# Patient Record
Sex: Female | Born: 1985 | Race: Black or African American | Hispanic: No | Marital: Single | Smoking: Never smoker
Health system: Southern US, Community
[De-identification: ages and names within clinical notes are randomized; demographics above are authoritative.]

## PROBLEM LIST (undated history)

## (undated) DIAGNOSIS — D649 Anemia, unspecified: Secondary | ICD-10-CM

## (undated) DIAGNOSIS — A599 Trichomoniasis, unspecified: Secondary | ICD-10-CM

## (undated) DIAGNOSIS — N898 Other specified noninflammatory disorders of vagina: Secondary | ICD-10-CM

## (undated) DIAGNOSIS — R002 Palpitations: Secondary | ICD-10-CM

## (undated) DIAGNOSIS — J309 Allergic rhinitis, unspecified: Secondary | ICD-10-CM

## (undated) DIAGNOSIS — R202 Paresthesia of skin: Secondary | ICD-10-CM

## (undated) DIAGNOSIS — F419 Anxiety disorder, unspecified: Secondary | ICD-10-CM

## (undated) HISTORY — DX: Allergic rhinitis, unspecified: J30.9

## (undated) HISTORY — DX: Paresthesia of skin: R20.2

## (undated) HISTORY — DX: Anemia, unspecified: D64.9

## (undated) HISTORY — DX: Palpitations: R00.2

## (undated) HISTORY — DX: Trichomoniasis, unspecified: A59.9

## (undated) HISTORY — DX: Other specified noninflammatory disorders of vagina: N89.8

---
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
## 2005-08-25 ENCOUNTER — Other Ambulatory Visit: Admission: RE | Admit: 2005-08-25 | Discharge: 2005-08-25 | Payer: Self-pay | Admitting: Obstetrics and Gynecology

## 2005-12-28 ENCOUNTER — Emergency Department (HOSPITAL_COMMUNITY): Admission: EM | Admit: 2005-12-28 | Discharge: 2005-12-28 | Payer: Self-pay | Admitting: Emergency Medicine

## 2006-09-03 ENCOUNTER — Emergency Department (HOSPITAL_COMMUNITY): Admission: EM | Admit: 2006-09-03 | Discharge: 2006-09-03 | Payer: Self-pay | Admitting: Emergency Medicine

## 2008-10-27 ENCOUNTER — Emergency Department (HOSPITAL_COMMUNITY): Admission: EM | Admit: 2008-10-27 | Discharge: 2008-10-27 | Payer: Self-pay | Admitting: Emergency Medicine

## 2008-10-31 ENCOUNTER — Emergency Department (HOSPITAL_COMMUNITY): Admission: EM | Admit: 2008-10-31 | Discharge: 2008-10-31 | Payer: Self-pay | Admitting: Emergency Medicine

## 2008-11-02 ENCOUNTER — Emergency Department (HOSPITAL_COMMUNITY): Admission: EM | Admit: 2008-11-02 | Discharge: 2008-11-02 | Payer: Self-pay | Admitting: Emergency Medicine

## 2008-11-04 ENCOUNTER — Emergency Department (HOSPITAL_COMMUNITY): Admission: EM | Admit: 2008-11-04 | Discharge: 2008-11-04 | Payer: Self-pay | Admitting: Family Medicine

## 2008-11-16 ENCOUNTER — Emergency Department (HOSPITAL_COMMUNITY): Admission: EM | Admit: 2008-11-16 | Discharge: 2008-11-16 | Payer: Self-pay | Admitting: Emergency Medicine

## 2008-11-17 ENCOUNTER — Emergency Department (HOSPITAL_COMMUNITY): Admission: EM | Admit: 2008-11-17 | Discharge: 2008-11-17 | Payer: Self-pay | Admitting: Emergency Medicine

## 2009-06-08 ENCOUNTER — Emergency Department (HOSPITAL_COMMUNITY): Admission: EM | Admit: 2009-06-08 | Discharge: 2009-06-08 | Payer: Self-pay | Admitting: Family Medicine

## 2009-11-09 ENCOUNTER — Emergency Department (HOSPITAL_COMMUNITY): Admission: EM | Admit: 2009-11-09 | Discharge: 2009-11-09 | Payer: Self-pay | Admitting: Family Medicine

## 2010-04-06 LAB — POCT RAPID STREP A (OFFICE): Streptococcus, Group A Screen (Direct): NEGATIVE

## 2010-04-28 LAB — POCT I-STAT, CHEM 8
BUN: <3 mg/dL — ABNORMAL LOW (ref 6–23)
Calcium, Ion: 1.18 mmol/L (ref 1.12–1.32)
Chloride: 104 mEq/L (ref 96–112)
Glucose, Bld: 127 mg/dL — ABNORMAL HIGH (ref 70–99)
HCT: 39 % (ref 36.0–46.0)
Hemoglobin: 13.3 g/dL (ref 12.0–15.0)
Sodium: 138 mEq/L (ref 135–145)

## 2010-04-28 LAB — RAPID URINE DRUG SCREEN, HOSP PERFORMED
Opiates: NOT DETECTED
Tetrahydrocannabinol: NOT DETECTED

## 2010-04-28 LAB — GLUCOSE, CAPILLARY: Glucose-Capillary: 116 mg/dL — ABNORMAL HIGH (ref 70–99)

## 2010-04-28 LAB — T4, FREE: Free T4: 1.51 ng/dL (ref 0.80–1.80)

## 2010-04-28 LAB — T3, FREE: T3, Free: 3.2 pg/mL (ref 2.3–4.2)

## 2010-06-03 ENCOUNTER — Emergency Department (HOSPITAL_COMMUNITY)
Admission: EM | Admit: 2010-06-03 | Discharge: 2010-06-03 | Disposition: A | Discharge status: Home / Self Care | Payer: Self-pay | Attending: Emergency Medicine | Admitting: Emergency Medicine

## 2010-06-03 DIAGNOSIS — T169XXA Foreign body in ear, unspecified ear, initial encounter: Secondary | ICD-10-CM | POA: Insufficient documentation

## 2010-06-03 DIAGNOSIS — H6120 Impacted cerumen, unspecified ear: Secondary | ICD-10-CM | POA: Insufficient documentation

## 2010-06-03 DIAGNOSIS — IMO0002 Reserved for concepts with insufficient information to code with codable children: Secondary | ICD-10-CM | POA: Insufficient documentation

## 2010-08-02 ENCOUNTER — Emergency Department (HOSPITAL_COMMUNITY): Payer: Self-pay

## 2010-08-02 ENCOUNTER — Emergency Department (HOSPITAL_COMMUNITY)
Admission: EM | Admit: 2010-08-02 | Discharge: 2010-08-02 | Disposition: A | Discharge status: Home / Self Care | Payer: Self-pay | Attending: Emergency Medicine | Admitting: Emergency Medicine

## 2010-08-02 DIAGNOSIS — J9801 Acute bronchospasm: Secondary | ICD-10-CM | POA: Insufficient documentation

## 2010-08-02 DIAGNOSIS — R05 Cough: Secondary | ICD-10-CM | POA: Insufficient documentation

## 2010-08-02 DIAGNOSIS — R0602 Shortness of breath: Secondary | ICD-10-CM | POA: Insufficient documentation

## 2010-08-02 DIAGNOSIS — R059 Cough, unspecified: Secondary | ICD-10-CM | POA: Insufficient documentation

## 2010-08-03 ENCOUNTER — Emergency Department (HOSPITAL_COMMUNITY): Payer: Self-pay

## 2010-08-03 ENCOUNTER — Emergency Department (HOSPITAL_COMMUNITY)
Admission: EM | Admit: 2010-08-03 | Discharge: 2010-08-03 | Disposition: A | Discharge status: Home / Self Care | Payer: Self-pay | Attending: Emergency Medicine | Admitting: Emergency Medicine

## 2010-08-03 DIAGNOSIS — R0682 Tachypnea, not elsewhere classified: Secondary | ICD-10-CM | POA: Insufficient documentation

## 2010-08-03 DIAGNOSIS — F411 Generalized anxiety disorder: Secondary | ICD-10-CM | POA: Insufficient documentation

## 2010-08-03 DIAGNOSIS — R0602 Shortness of breath: Secondary | ICD-10-CM | POA: Insufficient documentation

## 2010-08-03 DIAGNOSIS — F410 Panic disorder [episodic paroxysmal anxiety] without agoraphobia: Secondary | ICD-10-CM | POA: Insufficient documentation

## 2010-08-03 LAB — CBC
HCT: 35 % — ABNORMAL LOW (ref 36.0–46.0)
MCHC: 32.3 g/dL (ref 30.0–36.0)
RDW: 16.6 % — ABNORMAL HIGH (ref 11.5–15.5)

## 2010-08-03 LAB — BASIC METABOLIC PANEL
BUN: 9 mg/dL (ref 6–23)
CO2: 16 mEq/L — ABNORMAL LOW (ref 19–32)
Chloride: 107 mEq/L (ref 96–112)
Creatinine, Ser: 0.65 mg/dL (ref 0.50–1.10)
GFR calc Af Amer: >60 mL/min (ref >60)
Glucose, Bld: 105 mg/dL — ABNORMAL HIGH (ref 70–99)

## 2010-08-03 LAB — DIFFERENTIAL
Basophils Absolute: 0 10*3/uL (ref 0.0–0.1)
Basophils Relative: 0 % (ref 0–1)
Eosinophils Relative: 1 % (ref 0–5)
Lymphocytes Relative: 22 % (ref 12–46)
Monocytes Absolute: 0.8 10*3/uL (ref 0.1–1.0)

## 2010-08-03 LAB — BLOOD GAS, ARTERIAL
Acid-base deficit: 8.5 mmol/L — ABNORMAL HIGH (ref 0.0–2.0)
Bicarbonate: 11.7 mEq/L — ABNORMAL LOW (ref 20.0–24.0)
O2 Saturation: 99.1 %
Patient temperature: 98.6

## 2010-08-06 ENCOUNTER — Emergency Department (HOSPITAL_COMMUNITY)
Admission: EM | Admit: 2010-08-06 | Discharge: 2010-08-06 | Disposition: A | Discharge status: Home / Self Care | Payer: Medicaid Other | Attending: Emergency Medicine | Admitting: Emergency Medicine

## 2010-08-06 DIAGNOSIS — F410 Panic disorder [episodic paroxysmal anxiety] without agoraphobia: Secondary | ICD-10-CM | POA: Insufficient documentation

## 2010-08-06 DIAGNOSIS — F411 Generalized anxiety disorder: Secondary | ICD-10-CM | POA: Insufficient documentation

## 2010-08-10 ENCOUNTER — Emergency Department (HOSPITAL_COMMUNITY)
Admission: EM | Admit: 2010-08-10 | Discharge: 2010-08-10 | Discharge status: Against Medical Advice | Payer: Medicaid Other | Attending: Emergency Medicine | Admitting: Emergency Medicine

## 2010-08-10 DIAGNOSIS — R0609 Other forms of dyspnea: Secondary | ICD-10-CM | POA: Insufficient documentation

## 2010-08-10 DIAGNOSIS — R6889 Other general symptoms and signs: Secondary | ICD-10-CM | POA: Insufficient documentation

## 2010-08-10 DIAGNOSIS — R0989 Other specified symptoms and signs involving the circulatory and respiratory systems: Secondary | ICD-10-CM | POA: Insufficient documentation

## 2010-08-17 ENCOUNTER — Emergency Department (HOSPITAL_COMMUNITY)
Admission: EM | Admit: 2010-08-17 | Discharge: 2010-08-17 | Disposition: A | Discharge status: Home / Self Care | Payer: Medicaid Other | Attending: Emergency Medicine | Admitting: Emergency Medicine

## 2010-08-17 DIAGNOSIS — R259 Unspecified abnormal involuntary movements: Secondary | ICD-10-CM | POA: Insufficient documentation

## 2010-08-17 DIAGNOSIS — R0602 Shortness of breath: Secondary | ICD-10-CM | POA: Insufficient documentation

## 2010-08-17 DIAGNOSIS — R630 Anorexia: Secondary | ICD-10-CM | POA: Insufficient documentation

## 2010-08-17 DIAGNOSIS — F411 Generalized anxiety disorder: Secondary | ICD-10-CM | POA: Insufficient documentation

## 2010-08-17 DIAGNOSIS — R0989 Other specified symptoms and signs involving the circulatory and respiratory systems: Secondary | ICD-10-CM | POA: Insufficient documentation

## 2010-08-17 DIAGNOSIS — J3489 Other specified disorders of nose and nasal sinuses: Secondary | ICD-10-CM | POA: Insufficient documentation

## 2010-08-17 DIAGNOSIS — R0789 Other chest pain: Secondary | ICD-10-CM | POA: Insufficient documentation

## 2010-08-17 DIAGNOSIS — R0609 Other forms of dyspnea: Secondary | ICD-10-CM | POA: Insufficient documentation

## 2010-10-14 ENCOUNTER — Ambulatory Visit (INDEPENDENT_AMBULATORY_CARE_PROVIDER_SITE_OTHER): Payer: Self-pay | Admitting: Sports Medicine

## 2010-10-14 ENCOUNTER — Encounter: Payer: Self-pay | Admitting: Sports Medicine

## 2010-10-14 VITALS — BP 123/71 | HR 81 | Temp 98.5°F | Ht 69.5 in | Wt 165.0 lb

## 2010-10-14 DIAGNOSIS — F411 Generalized anxiety disorder: Secondary | ICD-10-CM

## 2010-10-14 DIAGNOSIS — F419 Anxiety disorder, unspecified: Secondary | ICD-10-CM | POA: Insufficient documentation

## 2010-10-14 DIAGNOSIS — E0590 Thyrotoxicosis, unspecified without thyrotoxic crisis or storm: Secondary | ICD-10-CM

## 2010-10-14 MED ORDER — BUSPIRONE HCL 10 MG PO TABS: 10.0000 mg | ORAL_TABLET | Freq: Two times a day (BID) | ORAL | Status: DC

## 2010-10-14 NOTE — Patient Instructions (Addendum)
It was nice meeting you today.  You said that you think you can take the medication Monday through Friday and see how you feel.  I suspect that you will want to to continue it after that . . .  We are going to check your thyroid levels and we will call if anything is abnormal.    Lets plan to see each other in 2-3 weeks.

## 2010-10-18 ENCOUNTER — Encounter: Payer: Self-pay | Admitting: Sports Medicine

## 2010-10-18 DIAGNOSIS — E0590 Thyrotoxicosis, unspecified without thyrotoxic crisis or storm: Secondary | ICD-10-CM | POA: Insufficient documentation

## 2010-10-18 NOTE — Progress Notes (Addendum)
25 yo female presenting to clinic for jitteriness and tremor.  She states her symptoms are worse when under stress; does not disrupt sleep; and that distraction lessens her tremor.  She has noticed these symptoms worsening since beginning school again but reports no other life changes that would be attributable to her symptoms. No mood changes; Does go to [facility] for counseling for anxiety Marchelle Folks [place]healage = counselor); 5 ED visits since July.  Have tried Ativan in past does not like the way it makes her feel.    She does report an 26# wt loss in the from her heaviest weight and a ~10# loss in last six months but no changes in nails/hair texture, no skin changes.  No bowel disturbances.  No sleep disturbances.  ROS: As per HPI otherwise 12 point ROS performed and negative.  PE: GENERAL:  Young adult African-American female, examined in [facility].  Alert and appropriately interactive but unable to sit still; jittery.  In mild discomfort; no respiratory distress. HNEENT: PERRLA, extra ocular movement intact, sclera clear, anicteric, oropharynx clear, no lesions, neck supple with midline trachea and thyroid without masses; thyroid not enlarged THORAX: HEART: S1, S2 normal, no murmur, rub or gallop, regular rate and rhythm LUNGS: clear to auscultation, no wheezes or rales and unlabored breathing ABDOMEN:  abdomen is soft without significant tenderness, masses, organomegaly or guarding EXTREMITIES: 2+.4 pulses; no edema,

## 2010-10-18 NOTE — Progress Notes (Signed)
TSH Result shows decresed levels -  Will persue hyperthyroidism workup.

## 2010-10-18 NOTE — Assessment & Plan Note (Signed)
Will start Buspar to help with apparent chronic anxiety.  Will also check TSH to rule out endocrine cause

## 2010-10-22 ENCOUNTER — Emergency Department (HOSPITAL_COMMUNITY)
Admission: EM | Admit: 2010-10-22 | Discharge: 2010-10-22 | Discharge status: Against Medical Advice | Payer: Medicaid Other | Attending: Emergency Medicine | Admitting: Emergency Medicine

## 2010-10-22 DIAGNOSIS — F411 Generalized anxiety disorder: Secondary | ICD-10-CM | POA: Insufficient documentation

## 2010-10-24 ENCOUNTER — Telehealth: Payer: Self-pay | Admitting: Sports Medicine

## 2010-10-24 DIAGNOSIS — E0590 Thyrotoxicosis, unspecified without thyrotoxic crisis or storm: Secondary | ICD-10-CM

## 2010-10-24 NOTE — Telephone Encounter (Signed)
Thought that she got bitten by a spider prompting an anxiety attack that lead to an ED visit over the weekend.    Side effects - slight dizziness, nausea and bad taste;  Jitteriness is improved but still intermittently returns.    Will come in to get TSH, T3, T4 checked at nurses visit.

## 2010-10-24 NOTE — Assessment & Plan Note (Signed)
Will bring in for nurses visit to check tsh t3 t4

## 2010-10-27 ENCOUNTER — Other Ambulatory Visit: Payer: Medicaid Other

## 2010-10-27 DIAGNOSIS — E0590 Thyrotoxicosis, unspecified without thyrotoxic crisis or storm: Secondary | ICD-10-CM

## 2010-10-27 NOTE — Progress Notes (Signed)
Tsh,f-t3 and f-t4 done today [facility] Derin Matthes

## 2010-10-28 LAB — T3, FREE: T3, Free: 3.4 pg/mL (ref 2.3–4.2)

## 2010-10-31 ENCOUNTER — Encounter: Payer: Self-pay | Admitting: Sports Medicine

## 2010-10-31 ENCOUNTER — Ambulatory Visit (INDEPENDENT_AMBULATORY_CARE_PROVIDER_SITE_OTHER): Payer: Medicaid Other | Admitting: Sports Medicine

## 2010-10-31 VITALS — BP 114/74 | HR 66 | Temp 98.2°F | Ht 69.6 in | Wt 163.0 lb

## 2010-10-31 DIAGNOSIS — F411 Generalized anxiety disorder: Secondary | ICD-10-CM

## 2010-10-31 DIAGNOSIS — N898 Other specified noninflammatory disorders of vagina: Secondary | ICD-10-CM | POA: Insufficient documentation

## 2010-10-31 DIAGNOSIS — F419 Anxiety disorder, unspecified: Secondary | ICD-10-CM

## 2010-10-31 DIAGNOSIS — Z23 Encounter for immunization: Secondary | ICD-10-CM

## 2010-10-31 DIAGNOSIS — R87619 Unspecified abnormal cytological findings in specimens from cervix uteri: Secondary | ICD-10-CM

## 2010-10-31 HISTORY — DX: Other specified noninflammatory disorders of vagina: N89.8

## 2010-10-31 MED ORDER — CLOTRIMAZOLE 1 % VA CREA
1.0000 | TOPICAL_CREAM | Freq: Every day | VAGINAL | Status: AC
Start: 2010-10-31 — End: 2010-11-07

## 2010-10-31 NOTE — Patient Instructions (Signed)
It was good to see you today.  I'm glad that you are feeling better and that the medication is working well for you.  Please fill the prescription for clotramazole   We are scheduling an appointment for your Colposcopy that will be done here.  Plan on seeing me in 2-3 weeks to follow up how you are feeling.

## 2010-11-03 ENCOUNTER — Ambulatory Visit (INDEPENDENT_AMBULATORY_CARE_PROVIDER_SITE_OTHER): Payer: Medicaid Other | Admitting: Family Medicine

## 2010-11-03 ENCOUNTER — Encounter: Payer: Self-pay | Admitting: Family Medicine

## 2010-11-03 ENCOUNTER — Other Ambulatory Visit (HOSPITAL_COMMUNITY)
Admission: RE | Admit: 2010-11-03 | Discharge: 2010-11-03 | Disposition: A | Discharge status: Home / Self Care | Payer: Medicaid Other | Source: Ambulatory Visit | Attending: Family Medicine | Admitting: Family Medicine

## 2010-11-03 VITALS — BP 117/61 | HR 54 | Temp 97.6°F | Ht 69.6 in | Wt 166.0 lb

## 2010-11-03 DIAGNOSIS — Z124 Encounter for screening for malignant neoplasm of cervix: Secondary | ICD-10-CM

## 2010-11-03 DIAGNOSIS — R87619 Unspecified abnormal cytological findings in specimens from cervix uteri: Secondary | ICD-10-CM

## 2010-11-03 DIAGNOSIS — Z01419 Encounter for gynecological examination (general) (routine) without abnormal findings: Secondary | ICD-10-CM | POA: Insufficient documentation

## 2010-11-03 DIAGNOSIS — Z309 Encounter for contraceptive management, unspecified: Secondary | ICD-10-CM

## 2010-11-03 DIAGNOSIS — R87810 Cervical high risk human papillomavirus (HPV) DNA test positive: Secondary | ICD-10-CM | POA: Insufficient documentation

## 2010-11-03 LAB — POCT URINE PREGNANCY: Preg Test, Ur: NEGATIVE

## 2010-11-04 DIAGNOSIS — R87619 Unspecified abnormal cytological findings in specimens from cervix uteri: Secondary | ICD-10-CM | POA: Insufficient documentation

## 2010-11-04 NOTE — Progress Notes (Signed)
  Subjective:    Patient ID: Elizabeth Hines, female    DOB: 08-11-1985, 25 y.o.   MRN: [id number]  HPI Abnormal Pap smear according to patient. Pap was done at the [facility]. She does not have the results. History of prior abnormal Pap and reportedly normal colposcopy done in 2011. No vaginal discharge at this time. No pelvic pain.    Review of Systems    Pertinent review of systems: negative for fever or unusual weight change.  Objective:   Physical Exam  GU externally normal Patient given informed consent, signed copy in the chart.  Placed in lithotomy position. Cervix viewed with speculum and colposcope after application of acetic acid.   Colposcopy adequate (entire squamocolumnar junctions seen  in entirety) ?  yes Acetowhite lesions?No Punctation?No Mosaicism?  No Abnormal vasculature?  No Biopsies?None ECC?No Complications? None  COMMENTS: Patient was given post procedure instructions.  I will notify her of any pathology results.       Assessment & Plan:  History of abnormal Paps, history of reportedly clinical normal colposcopy sometime within the last 2 years. He do not have any of these records. Today we will ahead and did a Pap smear as well as a colposcopy. Colposcopy was clinically normal. We'll await the results of her Pap smear.

## 2010-11-07 LAB — CBC
HCT: 39.7
MCV: 77.5 — ABNORMAL LOW
Platelets: 237
RDW: 17.1 — ABNORMAL HIGH

## 2010-11-07 LAB — DIFFERENTIAL
Basophils Absolute: 0
Eosinophils Absolute: 0
Eosinophils Relative: 1
Lymphocytes Relative: 37

## 2010-11-07 LAB — PROTIME-INR: Prothrombin Time: 14.2

## 2010-11-10 ENCOUNTER — Encounter: Payer: Self-pay | Admitting: Family Medicine

## 2010-11-14 ENCOUNTER — Telehealth: Payer: Self-pay | Admitting: Sports Medicine

## 2010-11-14 NOTE — Telephone Encounter (Signed)
Ms. Aydt is calling for Dr. Berline Chough, she says this is very important.  She is trying to figure out if one of the meds she is taking is causing her to have difficulty breathing or maybe it is her anxiety.

## 2010-11-14 NOTE — Telephone Encounter (Signed)
Returned call.  No answer.  Left message to call us back.

## 2010-11-17 NOTE — Assessment & Plan Note (Signed)
Will refer for colpo

## 2010-11-17 NOTE — Assessment & Plan Note (Signed)
No changes at this time to meds Will likely benefit from increasing dose but is not interested in that this visit.

## 2010-11-17 NOTE — Assessment & Plan Note (Signed)
Will rx clotrimaxole and refer to women's clinic for colpo

## 2010-11-17 NOTE — Progress Notes (Signed)
Subject: Pt reports feeling better.  Jitteryness is improved and she reports feeling much calmer.  Not interested in escalating medication amount right now.    ROS: Denies dyspnea, cp. No nausea, vomiting No constipation, diarrhea No changes in vision, hearing smell or taste Positive vaginal discharge, non odorus.  Also needs colpo for abnormal pap in past  PE: GENERAL:  Young adult African-American female, examined in [facility].  Alert and appropriate.  Much calmer than last visit.  Able to sit still and focus on conversation..  In no discomfort; no respiratory distress. HNEENT: extra ocular movement intact, sclera clear, anicteric and thyroid without masses THORAX: HEART: S1, S2 normal, no murmur, rub or gallop, regular rate and rhythm LUNGS: clear to auscultation, no wheezes or rales and unlabored breathing ABDOMEN:  abdomen is soft without significant tenderness, masses, organomegaly or guarding EXTREMITIES: warm well perfused distal pulses intact

## 2010-11-21 ENCOUNTER — Encounter: Payer: Self-pay | Admitting: Sports Medicine

## 2010-11-21 ENCOUNTER — Ambulatory Visit (INDEPENDENT_AMBULATORY_CARE_PROVIDER_SITE_OTHER): Payer: Medicaid Other | Admitting: Sports Medicine

## 2010-11-21 DIAGNOSIS — R87619 Unspecified abnormal cytological findings in specimens from cervix uteri: Secondary | ICD-10-CM

## 2010-11-21 DIAGNOSIS — F411 Generalized anxiety disorder: Secondary | ICD-10-CM

## 2010-11-21 DIAGNOSIS — F419 Anxiety disorder, unspecified: Secondary | ICD-10-CM

## 2010-11-21 NOTE — Assessment & Plan Note (Signed)
Seen by Dr. Jennette Kettle in women's clinic for colposcopy.  Posterior is negative for any lesions no biopsies were performed. 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 She'll need repeat Pap smear in one year.

## 2010-11-21 NOTE — Progress Notes (Signed)
Subjective:  MICHELENE KENISTON is a 25 y.o. female presenting today for followup of her chronic anxiety. She reports feeling much more mellow then she has in the past and attributes this to the medication. She reports she has had one at episode of headache malaise for 2 days meds will accompanying nausea but no vomiting she has had some associated dizziness made her have the sensation of stumbling although she never lost her balance. The headache is left-sided frontal and is associated with increased mucus production and cough. The symptoms have resolved at this time. Her anxiety is much better control as she does have a some twitching still especially which couldn't stressed.   ROS: Denies: fevers, chills.  No chest pain. Remainder of  review of systems as above.    PE: GENERAL:  Young adult African-American female, examined in [facility].  Alert and appropriate; occasional tics when talking about stress.  In no discomfort; no respiratory distress. HNEENT:  H&N: supple, no adenopathy and trachea midline OROPHARYNX: lips, mucosa, and tongue normal; teeth and gums normal EYES: conjunctivae/corneas clear. PERRL, EOM's intact. Fundi benign. EARS: normal TM's and external ear canals both ears NOSE: mucosal erythema THORAX: HEART: S1, S2 normal, no murmur, rub or gallop, regular rate and rhythm LUNGS: clear to auscultation, no wheezes or rales and unlabored breathing EXTREMITIES: warm well perfused; no deformity  >PSYCH: appears less stressed than last visit.  She's able to maintain focus and eye contact through the exam. She has good insight into her condition.

## 2010-11-21 NOTE — Patient Instructions (Addendum)
It was good to see you again today Elizabeth Hines.  Let's plan to continue your BuSpar and readdress this in the new year; you have 5 remaining refills on your BuSpar.  Regarding your abnormal Pap smear, this is consistent with prior Paps that you have had and will only continue further surveillance at this time. The next treatment for your abnormal Pap smear is the colposcopy you have already done.  I will discuss with Dr. Jennette Kettle, when you should return for a repeat Pap smear.  I like you to get over the counter Claritin and take one tablet by mouth each evening before bed. This will help with some of the symptoms you have been having specially her headaches.  With plan on seeing each other in 3 weeks to check to see how you're doing overall.

## 2010-11-21 NOTE — Assessment & Plan Note (Signed)
Will continue BuSpar at this time. We negotiated that we consider medication through the new year and that time we can reassess.  Patient does elicit some concerns that this could be an underlying neurologic issue however reassured by the fact this has improved.  He not feel like a neurology referral is appropriate this time however will consider symptoms worsen.  We will consider increasing her BuSpar over the next couple of months to a higher dose if we are looking for a more therapeutic effect.

## 2010-11-22 ENCOUNTER — Encounter: Payer: Self-pay | Admitting: Sports Medicine

## 2010-11-22 DIAGNOSIS — N760 Acute vaginitis: Secondary | ICD-10-CM | POA: Insufficient documentation

## 2010-11-22 DIAGNOSIS — B3731 Acute candidiasis of vulva and vagina: Secondary | ICD-10-CM | POA: Insufficient documentation

## 2010-11-22 DIAGNOSIS — N870 Mild cervical dysplasia: Secondary | ICD-10-CM

## 2010-11-22 DIAGNOSIS — B9689 Other specified bacterial agents as the cause of diseases classified elsewhere: Secondary | ICD-10-CM | POA: Insufficient documentation

## 2010-11-22 DIAGNOSIS — B373 Candidiasis of vulva and vagina: Secondary | ICD-10-CM | POA: Insufficient documentation

## 2010-11-22 HISTORY — DX: Mild cervical dysplasia: N87.0

## 2010-12-22 ENCOUNTER — Encounter: Payer: Self-pay | Admitting: Sports Medicine

## 2010-12-22 ENCOUNTER — Ambulatory Visit (INDEPENDENT_AMBULATORY_CARE_PROVIDER_SITE_OTHER): Payer: Medicaid Other | Admitting: Sports Medicine

## 2010-12-22 DIAGNOSIS — B373 Candidiasis of vulva and vagina: Secondary | ICD-10-CM

## 2010-12-22 DIAGNOSIS — F411 Generalized anxiety disorder: Secondary | ICD-10-CM

## 2010-12-22 DIAGNOSIS — F419 Anxiety disorder, unspecified: Secondary | ICD-10-CM

## 2010-12-22 MED ORDER — FLUCONAZOLE 150 MG PO TABS: 150.0000 mg | ORAL_TABLET | Freq: Once | ORAL | Status: AC

## 2010-12-22 MED ORDER — BUSPIRONE HCL 15 MG PO TABS: 15.0000 mg | ORAL_TABLET | Freq: Two times a day (BID) | ORAL | Status: DC

## 2010-12-22 NOTE — Patient Instructions (Signed)
It was great seeing you today.  We are in ago up on your BuSpar to 15 mg every 12 hours. Finish your current prescription of 10 mg and when you refill your medications please pickup the 15 mg.  I also sent in a prescription for fluconazole it should be ready later today.  Please schedule a followup appointment in 3-4 weeks.

## 2010-12-22 NOTE — Progress Notes (Signed)
Patient ID: Elizabeth Hines, female   DOB: 17-Apr-1985, 25 y.o.   MRN: [id number] Subjective:  Elizabeth Hines is a 25 y.o. female presenting today for followup for her chronic anxiety and for vaginal discharge and odor. She does report her chronic anxiety has been reduced by about 50% due to initiation of BuSpar. She does report one episode of depersonalization occurred one week after her last visit however she said she was able to work through it with her coping skills taught by Selena Batten her counselor at [employer] clinic. She does report that her counselor did suggest that they increased dose of BuSpar may be appropriate at this time and she is agreeable to this at this time. She does report of BuSpar does give her some mild dizziness it does make her feel mellow and somewhat tired although the dizziness and tiredness are subsiding longer she takes the medication.  She does report return of vaginal discharge as well as odor. E. cells were seen on her last Pap smear is performed with her Colposcopy.  She does state that she started using a different bath soap that has caused problems in the past for a period she will refrain from using soaps such as this and continue to use dial which is what she has used well in the past denies any urinary frequency hesitancy or hematuria.   She does report wanting to go back to school and is currently searching for a job although she is very frustrated with the job application process.  ROS: Denies any chest pain shortness of breath. No cough. Some dizziness as above no syncope or presyncope. Decrease jitteriness. Remainder of review of systems as per history of present illness.  PE: GENERAL:  Adult African American female examined in [facility]. Alert appropriately interactive mild jitteriness. No discomfort no respiratory distress. HNEENT: extra ocular movement intact, sclera clear, anicteric and trachea midline THORAX: HEART: S1, S2 normal, no murmur, rub or gallop, regular  rate and rhythm LUNGS: clear to auscultation, no wheezes or rales and unlabored breathing ABDOMEN:  abdomen is soft without significant tenderness, masses, organomegaly or guarding EXTREMITIES: Warm well-perfused no swelling, 2+ out of 4 distal pulses.

## 2010-12-22 NOTE — Assessment & Plan Note (Signed)
We will prescribe Diflucan and provide 2 tablets to be taken 3 days apart. Patient does report that one pill as not been enough in the past. She may be a candidate for prolonged Diflucan treatment of one pill Q. weekly for 6 weeks if she continues to have persistent infections.

## 2010-12-22 NOTE — Assessment & Plan Note (Addendum)
We will increase her BuSpar to 15 mg every 12 hours. She is very hesitant to continue this medication generally speaking however does recognize that this is greatly diminished her chronic anxiety by approximately 50%. Her jitteriness/tremulousness is also greatly improved since initiation of this medication. We'll continue to monitor her and continue counseling as this has been a great decrease in her daily disability and/or ED visits have been required for her anxiety.   We'll plan on performing PHQ. 9 at the next visit.

## 2010-12-27 ENCOUNTER — Telehealth: Payer: Self-pay | Admitting: *Deleted

## 2010-12-27 NOTE — Telephone Encounter (Signed)
Returned call to patient.  Wanted to know if she was to take increased dose of Buspar 15mg  once or twice a day.  Patient informed med should be taken twice a day (every 12 hours) per 12/22/10 office note.  Also, wanted to know how to take Diflucan that was prescribed.  Informed to take Diflucan 1 tablet and repeat 2nd tablet in 3 days.  Patient verbalized understanding.  Gaylene Brooks, RN

## 2011-01-04 ENCOUNTER — Telehealth: Payer: Self-pay | Admitting: Sports Medicine

## 2011-01-04 NOTE — Telephone Encounter (Signed)
Pt asking to speak with MD, says the buspar makes her feel more dizzy than the last medication prescribed

## 2011-01-05 NOTE — Telephone Encounter (Signed)
Patient states that the Buspar was making her dizzy, but yesterday she did not have that affect. We agreed that she will wait another day or through the weekend first to see what happens before any change is considered. Patient was also prescribed Diflucan and only took 1 pill due to the fact she was concerned about the interaction between the Buspar and that medication. She was wanting to know if the Diflucan will still work.

## 2011-01-10 NOTE — Telephone Encounter (Signed)
States dizziness has resolved after the first 3 days.  States that the new dose is not having worse side effects and that she is feeling better now.    She still expressed great concern regarding duration of therapy for her Buspar.  I explained over a long telephone conversation that she will likely need this medication as a long term med but not necessarily as a permanent medication.  She did report that she is apprehensive about stopping the medication because of how well she is doing.

## 2011-01-26 ENCOUNTER — Ambulatory Visit (INDEPENDENT_AMBULATORY_CARE_PROVIDER_SITE_OTHER): Payer: Medicaid Other | Admitting: Sports Medicine

## 2011-01-26 ENCOUNTER — Encounter: Payer: Self-pay | Admitting: Sports Medicine

## 2011-01-26 DIAGNOSIS — B373 Candidiasis of vulva and vagina: Secondary | ICD-10-CM

## 2011-01-26 DIAGNOSIS — F419 Anxiety disorder, unspecified: Secondary | ICD-10-CM

## 2011-01-26 DIAGNOSIS — F411 Generalized anxiety disorder: Secondary | ICD-10-CM
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
## 2011-01-26 NOTE — Patient Instructions (Addendum)
As always it was great to see today. I am glad to hear you're doing very well and that you have had a great new year.  Lets continue with our current plan of the Buspar 15mg  every 12 hours.  I am glad to hear the dizziness is improving.  Please let us know if it worsens or any other concerns come up.    Keep up with the writings and with yoga.  Try to increase how often you are doing both of these as it seems they are both helping with your anxiety.   I will plan on seeing you in 3-4 weeks to check on your progress.  Remember to write down any questions you have so that you can bring them with you when you see me next.

## 2011-01-28 NOTE — Assessment & Plan Note (Addendum)
Much improved on current dose of Buspar.  Will continue treatment at this time with no changes.  Pt unwilling to increase dose due to concerns for increase adverse effects.  Will continue with frequent follow up until pt more comfortable on stable dose.

## 2011-01-28 NOTE — Assessment & Plan Note (Signed)
Improved following last treatment with diflucan.  If reoccurs in near future would consider 6 week course of once weekly diflucan.  Will need wet prep with next reoccurance.

## 2011-02-14 ENCOUNTER — Ambulatory Visit (INDEPENDENT_AMBULATORY_CARE_PROVIDER_SITE_OTHER): Payer: Medicaid Other | Admitting: Sports Medicine

## 2011-02-14 ENCOUNTER — Encounter: Payer: Self-pay | Admitting: Sports Medicine

## 2011-02-14 VITALS — BP 128/76 | HR 88 | Temp 99.2°F | Ht 69.25 in | Wt 176.3 lb

## 2011-02-14 DIAGNOSIS — J069 Acute upper respiratory infection, unspecified: Secondary | ICD-10-CM | POA: Insufficient documentation

## 2011-02-14 DIAGNOSIS — F411 Generalized anxiety disorder: Secondary | ICD-10-CM

## 2011-02-14 DIAGNOSIS — F419 Anxiety disorder, unspecified: Secondary | ICD-10-CM

## 2011-02-14 DIAGNOSIS — L709 Acne, unspecified: Secondary | ICD-10-CM | POA: Insufficient documentation

## 2011-02-14 DIAGNOSIS — L708 Other acne: Secondary | ICD-10-CM

## 2011-02-14 MED ORDER — BUSPIRONE HCL 10 MG PO TABS: 10.0000 mg | ORAL_TABLET | Freq: Every day | ORAL | Status: DC

## 2011-02-14 MED ORDER — BUSPIRONE HCL 15 MG PO TABS: 15.0000 mg | ORAL_TABLET | Freq: Every day | ORAL | Status: DC

## 2011-02-14 NOTE — Assessment & Plan Note (Signed)
Patient does report much improvement of her anxiety with BuSpar, exercise and relaxation techniques.  She is continuously Veterinary surgeon. She does report some occasional severe dizziness following her morning dose of BuSpar. We will decrease to 10 mg every morning with 15 mg dosing at night. She'll also try to be a meal prior to taking her medication each time she is reporting this makes it better

## 2011-02-14 NOTE — Assessment & Plan Note (Addendum)
Mild tenderness over right maxilla likely to inflammation. No antibiotic indicated at this point as patient has not had symptoms for greater than 10-14 days. Patient given warning signs and instructions to call for return appointment if worsening with recurrent fevers. We'll continue to provide supportive treatment as per AVS

## 2011-02-14 NOTE — Patient Instructions (Signed)
It was great seeing you today.  I'm sorry to here you've been under the weather for the past week. I anticipate this getting better over the next week or 2. Continue with Robitussin you have been using it needed.  If it seems like you are getting worse and start running fevers or having chills please come back to the clinic to be reevaluated.   Regarding your skin. It is very important to establish a good cleaning and moisturizing routine.  I would suggest that you get Cetaphil face cleanser (or the generic equivalent) and use either Cetaphil or Dove face moisturizer.  It is important that you watch your face and use moisturizer twice a day the first in the morning and right before bed. If you have not had much improvement at her next visit we will talk about other medications we can use.  Regarding her BuSpar. I have given you a printout about the medication from [employer]. I will also look over the page of questions you brought with you and see if any are not answered. I'm glad answer these questions the next visit when I'm able to find all the correct answers for you.  We are going to change her medication regimen due to your dizziness to a 10 mg tablet at 11 AM, and a 15 mg tablet at 11 PM. Be sure to eat prior to taking this medication as he stated your dizziness is less severe when you're able to eat a meal before taking your medication.  Lets plan to see each other back in 3-4 weeks and follow up how you're doing.

## 2011-02-14 NOTE — Assessment & Plan Note (Signed)
Patient will establish routine per AVS.  Consider addition of other over-the-counter medications at next visit do to lack of insurance. May eventually benefit from a retinoid agent.

## 2011-02-14 NOTE — Progress Notes (Signed)
Patient ID: Elizabeth Hines, female   DOB: 1985/07/13, 26 y.o.   MRN: [id number] Subjective:  Elizabeth Hines is a 26 y.o. female presenting today for followup of her anxiety, one week of cold like symptoms, as well as questions regarding acne.  Anxiety: Patient reports anxiety is improved 70% since baseline. Which is a 10% improvement from last visit. She does however report an extreme dizziness after taking her BuSpar especially in the mornings after not eating a meal. She is occasionally had to take a nap due to the dizziness. Does not report any falls or trauma.   URI:  complains of congestion and productive cough for 7 days. She denies a history of anorexia, fevers, myalgias, nausea, shortness of breath, sweats, vomiting and weakness and does not a history of asthma. Patient denies smoke cigarettes.  SKIN: Reports no established daily hygiene practice. Has tried various over-the-counter cleansers and astringents. Does not use these on a regular basis in the past.  Patient does report touching her face quite often   ROS  Constitutional  reports dizziness as above   Infectious  reports no fevers or chills   Resp  report some facial pressure as well as rhinorrhea and mild cough   Cardiac  no palpitations   GI  no nausea vomiting constipation diarrhea      Skin  history of acne with worsening comedones on face.   Psych  mood  And anxiety as above   Neuro  tremor has much improved still with an occasional tic.   Trauma  none reported   MEDS  taking her medications on a regular basis.   ROS as per HPI and above otherwise 12 point ROS negative.   PE: GENERAL:  Adult Latin American female, examined in [facility]. In no acute discomfort, no respiratory distress HNEENT: AT/[place], MMM, no scleral icterus, EOMi, scattered comedones over bilateral cheeks and jawline.  Mild tenderness over R maxilla THORAX: HEART: RRR, S1/S2 heard, no murmur LUNGS: CTA B, no wheezes, no crackles EXTREMITIES: Moves all 4  extremities spontaneously, warm well perfused, no edema,  >PSYCH: Patient is still quite tangential, with nervous pressured speech on occasion. >NEURO: Occasional involuntary tic of the flexors of the upper extremity

## 2011-02-24 ENCOUNTER — Telehealth: Payer: Self-pay | Admitting: Sports Medicine

## 2011-02-24 NOTE — Telephone Encounter (Signed)
Small anxiety attack:   throat got dry, while in store - water in store made it better. Does report some mild cough and congestion.  Reassured this can be normal this time of the year and she may be coming down with a viral URI.    She reports deep breathing seemed to help.  Reassured that nothing she has said or experienced today seems to be life threatening.  Informed to call our emergency line if she is unsure if she needs to be evaluated in ED.  Pt does have multiple ED visits for prior anxiety attacks but has been much improved with Buspar.    Gaspar Bidding, DO [facility] Resident - PGY-1 02/24/2011 5:39 PM

## 2011-02-24 NOTE — Telephone Encounter (Signed)
Returned call to patient.  Left message---MD in clinic with patients.  Will route note to Dr. Berline Chough to contact Pt.  Gaylene Brooks, RN

## 2011-02-24 NOTE — Telephone Encounter (Signed)
Patient is calling to leave a message for Dr. Berline Chough.  She feel like she just had an anxiety attack and she wants to talk to him.

## 2011-02-28 ENCOUNTER — Telehealth: Payer: Self-pay | Admitting: Sports Medicine

## 2011-02-28 NOTE — Telephone Encounter (Signed)
Patient is calling because she wanted to speak to Dr. Berline Chough about when the best time is to get her PAP Smears.  The last was abnormal and she is having a hard time keeping up with when she needs to have them.

## 2011-02-28 NOTE — Telephone Encounter (Signed)
Called pt and informed her that I would forward the message to Dr. Berline Chough for advise.Laureen Ochs, Viann Shove

## 2011-03-02 NOTE — Telephone Encounter (Signed)
Please have her return in October for repeat PAP smear.  This is per recommendations from Dr. Jennette Kettle (who saw her in [facility] in 10/2010).  Dr. Jennette Kettle sent a letter regarding her findings.  If pt requests please resend a copy of the letter.

## 2011-03-02 NOTE — Telephone Encounter (Signed)
Resent letter.

## 2011-03-03 ENCOUNTER — Ambulatory Visit (INDEPENDENT_AMBULATORY_CARE_PROVIDER_SITE_OTHER): Payer: Self-pay | Admitting: Sports Medicine

## 2011-03-03 ENCOUNTER — Encounter: Payer: Self-pay | Admitting: Sports Medicine

## 2011-03-03 VITALS — BP 118/62 | HR 62 | Temp 98.9°F | Ht 69.6 in | Wt 178.0 lb

## 2011-03-03 DIAGNOSIS — F411 Generalized anxiety disorder: Secondary | ICD-10-CM

## 2011-03-03 DIAGNOSIS — N870 Mild cervical dysplasia: Secondary | ICD-10-CM

## 2011-03-03 DIAGNOSIS — L709 Acne, unspecified: Secondary | ICD-10-CM

## 2011-03-03 DIAGNOSIS — F419 Anxiety disorder, unspecified: Secondary | ICD-10-CM

## 2011-03-03 DIAGNOSIS — L708 Other acne: Secondary | ICD-10-CM

## 2011-03-03 NOTE — Assessment & Plan Note (Signed)
Pt needs repeat PAP in 10/2011.  Letter sent 2/7 to those regards and addressed with pt today

## 2011-03-03 NOTE — Assessment & Plan Note (Signed)
Non adherance to prior plan but pt willing to try again

## 2011-03-03 NOTE — Patient Instructions (Signed)
It was nice to see you today.  I am glad that your dizziness is much better and that your anxiety is still doing well.    Please see below for details about what we discussed:   We will get you a referal to a dentist - our office will be in touch  Remember to use the face regimen we talked about last time for your ACNE  For your dry throat you can dry Claritin or Zyrtec.  You can also try adding a humidifier to help with dry air in your home during the winter.  Try placing a bowl of water over the heat vent if you can't get a humidifier.    Please plan to return to see me in the middle of March.  If you need anything prior to seeing me please call the clinic.

## 2011-03-03 NOTE — Progress Notes (Signed)
Subjective:  Elizabeth Hines is a 26 y.o. female presenting today for follow up of her chronic anxiety.  Reports much improved overall.  She would like a referral to dentistry for a dental cleaning.  Reports some dry throat on occasion but no other focal resp symptoms.  Some concern of decreased sex drive since starting Buspar   ROS   Constitutional  reports dizziness but much improved from last visit   Infectious  reports no fevers or chills   Resp  no cough, no congestion, occasional dry throat  GI  no nausea vomiting constipation diarrhea   Skin  acne improved, not compliant with facial regimen but willing to try at this time  Psych  mood and anxiety much improved but still present  Neuro  tremor has much improved still with an occasional tic.   Trauma  none reported   MEDS  taking her medications on a regular basis. Improved regimen of 10/15 working well for her   Past Medical Hx Reviewed: yes Medications Reviewed: yes Family History Reviewed: yes  PE: GENERAL:  Adult female, examined in [facility]. In no acute discomfort, no respiratory distress HNEENT: AT/[place], MMM, no scleral icterus, EOMi, 1 comedones over midline jawline.  THORAX: HEART: RRR, S1/S2 heard, no murmur LUNGS: CTA B, no wheezes, no crackles EXTREMITIES: Moves all 4 extremities spontaneously, warm well perfused, no edema,  >PSYCH: Normal speech and affect today.  Non-tangental, congruent thought process

## 2011-03-03 NOTE — Assessment & Plan Note (Addendum)
Will continue on current regimen of 10 in AM and 15 qhs.  Pt happy with regimen.  All questions answered.  Pt does report some issue with impulsivity especially regarding spending money.  Feels it is a coping mechanism.  Doesn't spend into trouble and no prior legal troubles -  less likely bipolar but consider MDQ.     Discussed continuation of Buspar and explained I would like to keep her on the medication as long as the benefit is ou tweigh the side effects.  She reports wanting to stay on meds as side effects are not as bad as her prior anxiety

## 2011-03-03 NOTE — Progress Notes (Signed)
Addended by: Damita Lack on: 03/03/2011 05:19 PM   Modules accepted: Orders

## 2011-03-28 ENCOUNTER — Ambulatory Visit (INDEPENDENT_AMBULATORY_CARE_PROVIDER_SITE_OTHER): Payer: Self-pay | Admitting: Sports Medicine

## 2011-03-28 ENCOUNTER — Encounter: Payer: Self-pay | Admitting: Sports Medicine

## 2011-03-28 DIAGNOSIS — L708 Other acne: Secondary | ICD-10-CM

## 2011-03-28 DIAGNOSIS — Z6825 Body mass index (BMI) 25.0-25.9, adult: Secondary | ICD-10-CM

## 2011-03-28 DIAGNOSIS — F419 Anxiety disorder, unspecified: Secondary | ICD-10-CM

## 2011-03-28 DIAGNOSIS — E663 Overweight: Secondary | ICD-10-CM

## 2011-03-28 DIAGNOSIS — F411 Generalized anxiety disorder: Secondary | ICD-10-CM

## 2011-03-28 DIAGNOSIS — E0590 Thyrotoxicosis, unspecified without thyrotoxic crisis or storm: Secondary | ICD-10-CM

## 2011-03-28 DIAGNOSIS — L709 Acne, unspecified: Secondary | ICD-10-CM

## 2011-03-28 NOTE — Assessment & Plan Note (Signed)
Stable/Well Controlled - no changes at this time.  Once again discussed continuation of Buspar - pt voices that she feels she is doing better but is enjoying how she feels - willing to continue tx at this time.

## 2011-03-28 NOTE — Assessment & Plan Note (Addendum)
Wt gain discussed.  Dietary Counseling and discussion - content per AVS

## 2011-03-28 NOTE — Patient Instructions (Signed)
Great to see you today! I am excited that you were able to find TWO jobs.    For your heartburn you said that you would like to try TUMS for the time being.  If you continue to have symptoms or continue to worsen please come back to see [place] before your next scheduled visit.  Watching what you eat will also help with your heartburn as well as your weight gain. Remember the saying "Eat REAL foods and drink REAL drinks" - Limiting how many artificial sweeteners you are having per day as well as trying to reduce foods with High Fructose Corn Syrup and Partially Hydrogenated Oils is a good place to start.    A good rule to remember is that a typical serving size is approximately the size of your palm - this applies for most foods including meats & grains.   Please come back to see me in the middle of April (6-8 weeks)

## 2011-03-28 NOTE — Progress Notes (Signed)
Subjective:  Elizabeth Hines is a 26 y.o. female presenting today for follow up of her chronic anxiety.  Reports still doing well; NO dizziness.  Voices concerns today regarding weight gain, Indigestion. Reports having secured 2 jobs that should be starting soon.  ROS   Constitutional  report dizziness is no longer present following taking meds  Infectious  reports no fevers or chills   Resp  no cough, no congestion, occasional dry throat  GI  no nausea vomiting constipation diarrhea; mild reflux symptoms with some belching, and regurgitation following large meals, notices that red meat causes her symptoms to worsen, Mustard improves her symptoms; some increased clearing of her throat but no overt cough  Skin  acne improved, improved compliances with dove facial cleaner and moisturizer  Psych  mood and anxiety much improved but still present  Trauma  none reported   MEDS  taking her medications on a regular basis. Improved regimen of 10/15 working well for her   Past Medical Hx Reviewed: yes Medications Reviewed: yes Family History Reviewed: yes  PE: GENERAL:  Adult female, examined in [facility]. In no acute discomfort, no respiratory distress HNEENT: AT/[place], MMM, no scleral icterus, EOMi, Facial scarring from acne but no large erythematous comedones THORAX: HEART: RRR, S1/S2 heard, no murmur LUNGS: CTA B, no wheezes, no crackles EXTREMITIES: Moves all 4 extremities spontaneously, warm well perfused, no edema,  >PSYCH: Normal speech and affect today.  Non-tangental, congruent thought process

## 2011-03-28 NOTE — Assessment & Plan Note (Signed)
Check TSH at next visit

## 2011-03-28 NOTE — Assessment & Plan Note (Signed)
Improved with daily hygiene regimen

## 2011-04-28 ENCOUNTER — Encounter: Payer: Self-pay | Admitting: Sports Medicine

## 2011-04-28 ENCOUNTER — Ambulatory Visit (INDEPENDENT_AMBULATORY_CARE_PROVIDER_SITE_OTHER): Payer: Self-pay | Admitting: Sports Medicine

## 2011-04-28 VITALS — BP 111/68 | HR 55 | Ht 69.0 in | Wt 180.0 lb

## 2011-04-28 DIAGNOSIS — F419 Anxiety disorder, unspecified: Secondary | ICD-10-CM

## 2011-04-28 DIAGNOSIS — F411 Generalized anxiety disorder: Secondary | ICD-10-CM

## 2011-04-28 DIAGNOSIS — M549 Dorsalgia, unspecified: Secondary | ICD-10-CM | POA: Insufficient documentation

## 2011-04-28 DIAGNOSIS — M999 Biomechanical lesion, unspecified: Secondary | ICD-10-CM

## 2011-04-28 NOTE — Patient Instructions (Signed)
As always it was great to see you.  I am glad to hear that things are going well for you.  Remember the stretches for your IT band and you can do these up to 3 times per day.  You can ice your knee and lateral thigh as needed.   If you find that you are slightly sore, you can take Ibuprofen as directed on the bottle.  I will plan to see you in 2 months for a follow up appointment.  If you feel like your mood is worsening between now and then come in for an earlier appointment.

## 2011-04-28 NOTE — Assessment & Plan Note (Signed)
Stable/Well Controlled - no changes at this time. 

## 2011-04-28 NOTE — Assessment & Plan Note (Addendum)
Pt treated with OMT for SD of Lumbar, Sacral, Pelvic and LE.   Treated with ME, MFR, CS - pt tolerated treatment well  Long lever to pelvis Ibuprofen for post treatment soreness

## 2011-04-28 NOTE — Progress Notes (Signed)
HPI:  Elizabeth Hines is a 26 y.o. female presenting today for follow up of her chronic anxiety.  Reports her symptoms are much improved.  His return to the work force at [employer].  Doing well with this.  A sitter thinking is much more clear on the medicine.  Forger mood is slightly down on rainy days however this is only a very rare occasion he overall is feeling good and reports no depression.   ROS  Constitutional  doing well   Infectious  no fever no chills   Neuro  dizziness is improved   MSK  right low back and left knee pain following lifting incident work.  No radiation of pain, no weakness, falling, no change in bowel or bladder   MEDS  doing well reports missing her doses     Past Medical Hx Reviewed: yes Medications Reviewed: yes Family History Reviewed: yes  PE: GENERAL:  Adult A. a female.  Examined in  [facility].   Sitting comfortably in exam room  In no discomfort; norespiratory distress.   PSYCH: Alert and appropriately interactive - thought content appropriate, mood is euthymic EXTREMITIES:  Moves all 4 extremities spontaneously, no swelling, no erythema >MSK/Osteopathic:  LUMBAR  L3 FRLSR  PELVIS  L5 UP TP  TTP over R SI  R anterior Rotation  SACRUM  RonL rotation  LE  Restricted L ADDuction. TTP over Gerdy's Tubercle and body of ITB  L Posterior fibular head  L Tibial Extorsion

## 2011-05-31 ENCOUNTER — Encounter: Payer: Self-pay | Admitting: Sports Medicine

## 2011-05-31 ENCOUNTER — Ambulatory Visit (INDEPENDENT_AMBULATORY_CARE_PROVIDER_SITE_OTHER): Payer: Medicaid Other | Admitting: Sports Medicine

## 2011-05-31 VITALS — BP 120/77 | HR 71 | Temp 98.3°F | Ht 69.0 in | Wt 183.0 lb

## 2011-05-31 DIAGNOSIS — F411 Generalized anxiety disorder: Secondary | ICD-10-CM

## 2011-05-31 DIAGNOSIS — M539 Dorsopathy, unspecified: Secondary | ICD-10-CM

## 2011-05-31 DIAGNOSIS — M62830 Muscle spasm of back: Secondary | ICD-10-CM

## 2011-05-31 DIAGNOSIS — E663 Overweight: Secondary | ICD-10-CM

## 2011-05-31 DIAGNOSIS — M999 Biomechanical lesion, unspecified: Secondary | ICD-10-CM

## 2011-05-31 DIAGNOSIS — F419 Anxiety disorder, unspecified: Secondary | ICD-10-CM

## 2011-05-31 DIAGNOSIS — Z6825 Body mass index (BMI) 25.0-25.9, adult: Secondary | ICD-10-CM

## 2011-05-31 NOTE — Patient Instructions (Signed)
It was great to see you.  I do not want to change any of your meds.  For your back.  Keep doing the "hula-hoop" exercises with both your feet together and with one foot behind the other on both sides.  You said you would like to see me at the end of June!  Come back to see me before that if you need anything.

## 2011-06-04 DIAGNOSIS — M62830 Muscle spasm of back: Secondary | ICD-10-CM | POA: Insufficient documentation

## 2011-06-04 NOTE — Assessment & Plan Note (Signed)
Treated with OMT and rx home exercises

## 2011-06-04 NOTE — Progress Notes (Signed)
HPI:  Elizabeth Hines is a 26 y.o. female presenting today for follow-up of chronic anxiety and back pain.  She reports her chronic anxiety is much improved and she is currently doing well on her current dose of buspar.  No med effects. No palpitations, nausea, vomiting, dizziness. Her back pain has worsened since starting her 2nd job at the [employer] in [employer].  She reports R lumbar discomfort worse following a long time on her feet.  No weakness, no radicular sx. No falls    ROS Per hpi  HISTORY Medications Reviewed & Updated, see associated section Medical Hx Reviewed: Significant for chronic anxiety Social History Reviewed:  Significant for non-smoker  PE: GENERAL:  Adult AA female.  Examined in [facility].  Sitting comfortably on exam room table  In no discomfort; norespiratory distress.   PSYCH: Alert and appropriately interactive; Insight:Good  Alert, oriented, thought content appropriate, alertness: alert, affect: normal, thought content exhibits logical connections H&N: AT/[place], MMM, no scleral icterus, EOMi THORAX: HEART: RRR, S1/S2 heard, no murmur LUNGS: CTA B, no wheezes, no crackles ABDOMEN:  +BS, soft, non-tender, no rigidity, no guarding, no masses/organomegaly EXTREMITIES: Moves all 4 extremities spontaneously, warm well perfused, no edema, bilateral DP and PT pulses 2/4.   >MSK/Osteopathic:  LUMBAR  L3 RL SL  L5 RR SR  SACRUM  RonR s  PELVIS  R anterior Innominat  LE  R hip in external rotation

## 2011-06-04 NOTE — Assessment & Plan Note (Addendum)
Stable; pt is happy with treatment plans with 10 & 15mg  buspar

## 2011-06-04 NOTE — Assessment & Plan Note (Signed)
Treated lumbar spine, sacrum, pelvis and LE due to generalized back pain. Pt tolerated tx well and had significant improvement in her symptoms prior to leaving office

## 2011-06-04 NOTE — Assessment & Plan Note (Signed)
Discussed continued weight gain.  Pt will attempt to substitute water for juice as well as watch her portion sizes

## 2011-07-12 ENCOUNTER — Ambulatory Visit (INDEPENDENT_AMBULATORY_CARE_PROVIDER_SITE_OTHER): Payer: Medicaid Other | Admitting: Sports Medicine

## 2011-07-12 ENCOUNTER — Encounter: Payer: Self-pay | Admitting: Sports Medicine

## 2011-07-12 VITALS — BP 110/78 | HR 52 | Temp 97.9°F | Ht 69.0 in | Wt 183.7 lb

## 2011-07-12 DIAGNOSIS — Z309 Encounter for contraceptive management, unspecified: Secondary | ICD-10-CM

## 2011-07-12 DIAGNOSIS — F411 Generalized anxiety disorder: Secondary | ICD-10-CM

## 2011-07-12 DIAGNOSIS — B070 Plantar wart: Secondary | ICD-10-CM

## 2011-07-12 DIAGNOSIS — E0590 Thyrotoxicosis, unspecified without thyrotoxic crisis or storm: Secondary | ICD-10-CM

## 2011-07-12 DIAGNOSIS — N870 Mild cervical dysplasia: Secondary | ICD-10-CM

## 2011-07-12 DIAGNOSIS — F419 Anxiety disorder, unspecified: Secondary | ICD-10-CM

## 2011-07-12 MED ORDER — BUSPIRONE HCL 10 MG PO TABS: 10.0000 mg | ORAL_TABLET | Freq: Every day | ORAL | Status: DC

## 2011-07-12 NOTE — Progress Notes (Signed)
HPI:  Elizabeth Hines is a 26 y.o. female presenting today for follow-up of chronic anxiety.  Also reports plantar warts.  Anxiety is much improved and is doing well on her current medication regimen.  No concerns.  Dizziness resolved. Still has some irritablity but unwilling to make changes to her regimen.  Reports missed period last month.  Sexually active, uses condoms, no interested in becoming pregnant but explicitly says she is not interested in other forms of contraception.  No known STD exposure or symptoms.  Reports plantar warts B.  Has not tried anything.  Worse during the summer now that she is on her feet on a daily basis at her 2 jobs.  ROS See HPI  HISTORY Medications Reviewed & Updated, see associated section Medical Hx Reviewed: Significant for Chronic Axniety, CIN I with need for PAP in 10/2011 Social History Reviewed:  Significant for non-smoker   PE: GENERAL:  Adult AA female.  Examined in [facility].  Sitting comfortably on exam room table  In no discomfort; norespiratory distress.   PSYCH: Alert and appropriately interactive; Insight:Good  Alert, oriented, thought content appropriate, alertness: alert, affect: normal, thought content exhibits logical connections, not anxious appearing H&N: AT/[place], MMM, no scleral icterus, EOMi THORAX: HEART: RRR, S1/S2 heard, no murmur LUNGS: CTA B, no wheezes, no crackles EXTREMITIES: Moves all 4 extremities spontaneously, warm well perfused, no edema, bilateral DP and PT pulses 2/4.  Areas of hyperkeratosis with central darkening consistent with plantar warts on bilateral feet

## 2011-07-12 NOTE — Patient Instructions (Addendum)
It was great to see you.  I am happy to hear you are doing so well.  Please remember to take your medications daily.  I have sent in a refill for your 10mg  prescription.  For plantar warts try picking up Salicylic acid and applying it to the area.  You can then put a piece of Duct Tape over the area and leave in place until it falls off (should be around 3 days).  Continue to do this until the area has totally cleared up.  I will see you back in August!

## 2011-07-24 ENCOUNTER — Encounter: Payer: Self-pay | Admitting: Sports Medicine

## 2011-07-29 DIAGNOSIS — B070 Plantar wart: Secondary | ICD-10-CM | POA: Insufficient documentation

## 2011-07-29 DIAGNOSIS — Z309 Encounter for contraceptive management, unspecified: Secondary | ICD-10-CM | POA: Insufficient documentation

## 2011-07-29 NOTE — Assessment & Plan Note (Signed)
Directed use OTC salicylic acid, and duct tape at this time.  see AVS for further instructions

## 2011-07-29 NOTE — Assessment & Plan Note (Signed)
Pap smear, due at next office visit

## 2011-07-29 NOTE — Assessment & Plan Note (Signed)
Discussed options regarding contraception.  At this time patient wishes to use barrier protection.  High failure rate, even with ideal use, discussed with the patient patient voices understanding.  She does not wish to become pregnant at this time and is considering options for hormonal contraception.

## 2011-07-29 NOTE — Assessment & Plan Note (Signed)
The patient is currently stable on BuSpar regimen.  She wishes to maintain her regimen at this time.  No changes

## 2011-07-31 ENCOUNTER — Telehealth: Payer: Self-pay | Admitting: Sports Medicine

## 2011-07-31 NOTE — Telephone Encounter (Signed)
Patient is calling to find out if it is ok to change the time that she takes the 15 mg because she is always forgetting to take it at the time instructed.

## 2011-08-04 NOTE — Telephone Encounter (Signed)
Called and informed pt of Dr. Janeece Riggers instructions. Pt voiced understanding and agreed.Loralee [place]

## 2011-08-04 NOTE — Telephone Encounter (Signed)
Yes this is fine,  She should try to take them approximately 12 hours apart but more importantly she needs to take them twice a day

## 2011-09-22 ENCOUNTER — Encounter: Payer: Self-pay | Admitting: Sports Medicine

## 2011-09-22 ENCOUNTER — Ambulatory Visit (INDEPENDENT_AMBULATORY_CARE_PROVIDER_SITE_OTHER): Payer: Self-pay | Admitting: Sports Medicine

## 2011-09-22 VITALS — BP 120/65 | HR 53 | Temp 98.7°F | Ht 69.0 in | Wt 185.0 lb

## 2011-09-22 DIAGNOSIS — E663 Overweight: Secondary | ICD-10-CM

## 2011-09-22 DIAGNOSIS — M999 Biomechanical lesion, unspecified: Secondary | ICD-10-CM

## 2011-09-22 DIAGNOSIS — N870 Mild cervical dysplasia: Secondary | ICD-10-CM

## 2011-09-22 DIAGNOSIS — Z6825 Body mass index (BMI) 25.0-25.9, adult: Secondary | ICD-10-CM

## 2011-09-22 DIAGNOSIS — Z0000 Encounter for general adult medical examination without abnormal findings: Secondary | ICD-10-CM

## 2011-09-22 DIAGNOSIS — F411 Generalized anxiety disorder: Secondary | ICD-10-CM

## 2011-09-22 DIAGNOSIS — F419 Anxiety disorder, unspecified: Secondary | ICD-10-CM

## 2011-09-22 MED ORDER — BUSPIRONE HCL 15 MG PO TABS: 15.0000 mg | ORAL_TABLET | Freq: Every day | ORAL | Status: DC

## 2011-09-22 MED ORDER — BUSPIRONE HCL 10 MG PO TABS: 10.0000 mg | ORAL_TABLET | Freq: Every day | ORAL | Status: DC

## 2011-09-22 NOTE — Assessment & Plan Note (Signed)
Stable/Well Controlled - no changes at this time. Refilled both rxs

## 2011-09-22 NOTE — Assessment & Plan Note (Signed)
Encouraged lifestyle modification

## 2011-09-22 NOTE — Assessment & Plan Note (Signed)
Decision today to treat with OMT was based on Physical Exam  After verbal consent patient was treated with articulatory, ME, and Still techniques in lumbar, pelvic, and sacrum areas  Patient tolerated the procedure well with improvement in symptoms  Patient given exercises, stretches and lifestyle modifications  See medications in patient instructions if given  Patient will follow up prn

## 2011-09-22 NOTE — Patient Instructions (Addendum)
I will refill your medications.  Please come in in the next 1-2 weeks to have blood work drawn first thing the morning before you eat.  Return in 6 weeks for your PAP smear.

## 2011-09-22 NOTE — Assessment & Plan Note (Signed)
PAP due in October.

## 2011-09-22 NOTE — Progress Notes (Signed)
  [facility]  Patient name: Elizabeth Hines MRN [id number]  Date of birth: 1985-06-13  CC & HPI:  Elizabeth Hines is a 26 y.o. female presenting today for follow up of chronic anxiety.  Reports anxiety is doing well.  Needs refills on her buspar.  Handling situations well.  Overweight:  Pt has had 20# weight gain in 1 year.    Pt reports 1 episode of a rectal fissure.  She reports these in the past.  Reports constipation.  No reoccurrence since adding fruits and vegetables  R back pain.  Reoccurant.  Reports thinks it is due to standing on her feet.  Has not tried anything.  Requesting OMT.  No radicular syptoms, no falls.  Located mainly over R SI joint  ROS:  Per HPI  Pertinent History Reviewed:  Medical & Surgical Hx:  Reviewed: Significant for Chronic anxiety Medications: Reviewed & Updated - see associated section Social History: Reviewed - Significant for non-smoker  Objective Findings:  Vitals:  Filed Vitals:   09/22/11 1354  BP: 120/65  Pulse: 53  Temp: 98.7 F (37.1 C)    PE: GENERAL:  Adult AA female. In no discomfort; no respiratory distress. PSYCH: Alert and appropriately interactive; Insight:Good - thought process and content appropriate; not agitated   EXTREMITIES: Moves all 4 extremities spontaneously, warm well perfused, no edema, bilateral DP and PT pulses 2/4.   Seated Flexion/ASIS CompressionTest: right Leg Length screening: pseudoshort left leg LUMBAR  L5 rotated R  SACRUM  LonL rotation  PELVIS  R anterior innominant    Assessment & Plan:

## 2011-10-06 ENCOUNTER — Other Ambulatory Visit: Payer: Medicaid Other

## 2011-10-06 DIAGNOSIS — Z0000 Encounter for general adult medical examination without abnormal findings: Secondary | ICD-10-CM

## 2011-10-06 DIAGNOSIS — Z131 Encounter for screening for diabetes mellitus: Secondary | ICD-10-CM

## 2011-10-06 LAB — LIPID PANEL
LDL Cholesterol: 98 mg/dL (ref 0–99)
VLDL: 15 mg/dL (ref 0–40)

## 2011-10-06 LAB — TSH: TSH: 1.589 u[IU]/mL (ref 0.350–4.500)

## 2011-10-06 NOTE — Progress Notes (Signed)
FLP,TSH AND GLUCOSE DONE TODAY Elizabeth Hines

## 2011-10-07 LAB — GLUCOSE, FASTING: Glucose, Fasting: 89 mg/dL (ref 70–99)

## 2011-10-09 ENCOUNTER — Encounter: Payer: Self-pay | Admitting: Sports Medicine

## 2011-10-16 ENCOUNTER — Telehealth: Payer: Self-pay | Admitting: Sports Medicine

## 2011-10-16 NOTE — Telephone Encounter (Signed)
Patient is calling because she received her lab results in the mail, but she doesn't understand how to read them and would like to speak to someone.

## 2011-10-18 NOTE — Telephone Encounter (Signed)
Forwarded to pcp.Elizabeth Hines  

## 2011-10-18 NOTE — Telephone Encounter (Signed)
Called and LVM.  Unsure of what ?s she has.  All labs were normal.    Please let her know if she calls back that she is not diabetic and that her cholesterol is fine.  Also her thyroid is functioning normally.

## 2011-10-30 ENCOUNTER — Encounter: Payer: Medicaid Other | Admitting: Sports Medicine

## 2011-11-17 ENCOUNTER — Other Ambulatory Visit (HOSPITAL_COMMUNITY)
Admission: RE | Admit: 2011-11-17 | Discharge: 2011-11-17 | Disposition: A | Discharge status: Home / Self Care | Payer: Medicaid Other | Source: Ambulatory Visit | Attending: Family Medicine | Admitting: Family Medicine

## 2011-11-17 ENCOUNTER — Ambulatory Visit (INDEPENDENT_AMBULATORY_CARE_PROVIDER_SITE_OTHER): Payer: Medicaid Other | Admitting: Sports Medicine

## 2011-11-17 ENCOUNTER — Encounter: Payer: Self-pay | Admitting: Sports Medicine

## 2011-11-17 VITALS — BP 121/69 | HR 68 | Temp 98.9°F | Ht 69.0 in | Wt 189.9 lb

## 2011-11-17 DIAGNOSIS — Z01419 Encounter for gynecological examination (general) (routine) without abnormal findings: Secondary | ICD-10-CM | POA: Insufficient documentation

## 2011-11-17 DIAGNOSIS — Z124 Encounter for screening for malignant neoplasm of cervix: Secondary | ICD-10-CM

## 2011-11-17 DIAGNOSIS — Z6825 Body mass index (BMI) 25.0-25.9, adult: Secondary | ICD-10-CM

## 2011-11-17 DIAGNOSIS — N760 Acute vaginitis: Secondary | ICD-10-CM

## 2011-11-17 DIAGNOSIS — Z0000 Encounter for general adult medical examination without abnormal findings: Secondary | ICD-10-CM

## 2011-11-17 DIAGNOSIS — F419 Anxiety disorder, unspecified: Secondary | ICD-10-CM

## 2011-11-17 DIAGNOSIS — F411 Generalized anxiety disorder: Secondary | ICD-10-CM

## 2011-11-17 DIAGNOSIS — Z113 Encounter for screening for infections with a predominantly sexual mode of transmission: Secondary | ICD-10-CM | POA: Insufficient documentation

## 2011-11-17 DIAGNOSIS — E663 Overweight: Secondary | ICD-10-CM

## 2011-11-17 DIAGNOSIS — N870 Mild cervical dysplasia: Secondary | ICD-10-CM

## 2011-11-17 DIAGNOSIS — Z309 Encounter for contraceptive management, unspecified: Secondary | ICD-10-CM

## 2011-11-17 NOTE — Patient Instructions (Addendum)
It was good to see you today.  Follow up in January.  Remember that for foods you "can, but don't want to".  Here are some basic nutrition rules to remember:  "Eat Real Foods & Drink Real Drinks" - if you think it was made in a factory . . it is likely best to avoid it as a staple in your diet.  Limiting these types of foods to 1-2 times per week is a good idea.  Sticking with fresh fruits and vegetables as well as home cooked meals will typically provide more nutrition and less salt than prepackaged meals.     Limit the amount of sugar sweetened and artificially sweetened foods and beverages.  Sticking with water flavored with a slice of lemon, lime or orange is a great option if you want something with flavor in it.  Using flavored seltzer water to flavor plain water will also add some bite if you want something more than flavor.     Here are 2 of my favorite web sites that provide great nutrition and exercise advice.   www.eatsmartmovemore[place].com www.choosemyplate.gov

## 2011-11-22 NOTE — Assessment & Plan Note (Signed)
PAP obtained today.  Results pending

## 2011-11-22 NOTE — Assessment & Plan Note (Signed)
Reports abstinence.  Does not wish for hormonal contraception. Encouraged use of barrier and discussed high failure rate

## 2011-11-22 NOTE — Progress Notes (Signed)
  [facility]  Patient name: Elizabeth Hines MRN [id number]  Date of birth: 05-27-1985  CC & HPI:  Elizabeth Hines is a 26 y.o. female presenting today for CPE:  Needs PAP, Hx of CIN I, last pap 1 year ago  # reports chronic anxiety continues to remain stable on Buspar.  Decreased adherence with medications and doing well.  Continues with couseling  # weight gain.  Continues to gain.  Still drinking juices and sweetened beverages  ROS:  Denies, chest pain, shortness of breath.  Pertinent History Reviewed:  Medical & Surgical Hx:  Reviewed: Significant for Chronic Anxiety, Hx of CIN I, Medications: Reviewed & Updated - see associated section Social History: Reviewed -  reports that she has never smoked. She does not have any smokeless tobacco history on file.  Objective Findings:  Vitals:  Filed Vitals:   11/17/11 1534  BP: 121/69  Pulse: 68  Temp: 98.9 F (37.2 C)    PE: GENERAL:  Adult AA female. In no discomfort; no respiratory distress. PSYCH: Alert and appropriately interactive; Insight:Good   H&N: AT/[place], trachea midline EENT:  MMM, no scleral icterus, EOMi HEART: RRR, S1/S2 heard, no murmur LUNGS: CTA B, no wheezes, no crackles EXTREMITIES: Moves all 4 extremities spontaneously, warm well perfused, no edema, bilateral DP and PT pulses 2/4.   PELVIC: normal external genitalia, vulva, vagina, cervix, uterus and adnexa. PAP performed today     Assessment & Plan:

## 2011-11-22 NOTE — Assessment & Plan Note (Signed)
Stable/Well Controlled - no changes at this time. 

## 2011-11-22 NOTE — Assessment & Plan Note (Signed)
Screening for CG/Chlamydia

## 2011-11-22 NOTE — Assessment & Plan Note (Signed)
Encouraged TLC

## 2011-11-23 ENCOUNTER — Encounter: Payer: Self-pay | Admitting: Sports Medicine

## 2011-11-23 DIAGNOSIS — N870 Mild cervical dysplasia: Secondary | ICD-10-CM

## 2011-12-11 ENCOUNTER — Telehealth: Payer: Self-pay | Admitting: Sports Medicine

## 2011-12-11 NOTE — Telephone Encounter (Signed)
Pt calling wanted to know if she needed to make an appt. For anxiety. I asked her if she was taking her meds as directed she stated that she is and that she doesn't feel that she really should come in to be seen I told her that if she felt that the anxiety was out of control that she should come in she stated no and that she has been handling things pretty well and did not see a need to come in.Elizabeth Hines

## 2011-12-19 ENCOUNTER — Encounter: Payer: Self-pay | Admitting: Family Medicine

## 2011-12-19 ENCOUNTER — Ambulatory Visit (INDEPENDENT_AMBULATORY_CARE_PROVIDER_SITE_OTHER): Payer: Self-pay | Admitting: Family Medicine

## 2011-12-19 VITALS — BP 126/78 | HR 71 | Ht 69.0 in | Wt 188.0 lb

## 2011-12-19 DIAGNOSIS — R002 Palpitations: Secondary | ICD-10-CM

## 2011-12-19 DIAGNOSIS — Z23 Encounter for immunization: Secondary | ICD-10-CM

## 2011-12-19 HISTORY — DX: Palpitations: R00.2

## 2011-12-19 NOTE — Progress Notes (Signed)
  Subjective:    Patient ID: Elizabeth Hines, female    DOB: 1985-03-11, 26 y.o.   MRN: [id number]  HPI  Patient reports on November 18th, woke up at around 11 am and felt heart fluttering and racing.  Deep breathing resolved.  The second episode was 2 days ago, as she was going to sleep, felt her heart fluttering again.  Episodes last about 1-2 minutes, resolved after deep breathing.  Associated with clammy hands and intense fear.  Reports episodes was similar at the time her anxiety was at its peak- but has been better controlled since being on buspar for about a year.  No new medicines.  No new stressors.    Not exertional, no dizziness, dyspnea, cheat pain.  Review of Systems    No fever, chills, weight loss, dyspnea.   Objective:   Physical Exam GEN: Alert & Oriented, No acute distress HEENT: no thyromegaly. CV:  Regular Rate & Rhythm, no murmur Respiratory:  Normal work of breathing, CTAB Abd:  + BS, soft, no tenderness to palpation Ext: no pre-tibial edema        Assessment & Plan:

## 2011-12-19 NOTE — Assessment & Plan Note (Signed)
Reassured patient that symptoms likely represent anxiety/panic attacks.  Discussed nature of these, and encouraged continuing buspar, and behavioral management.  Suggested addnig exercise to her deep breathing exercises for anxiety control.  Gave info on therapist if she feel sit becomes more debilitating in the future.  WIll check TSH and CBC given history of suppressed TSH and anemia.

## 2011-12-19 NOTE — Patient Instructions (Addendum)
I think your dose of medicine is working well for you.  No changes today  Will check your thyroid and iron level  I recommend daily exercise (not right before bed) to help managing anxiety.  Keep doing deep breathing!  Therapist can help with managing life's stresses: Please contact Dr. Pascal Lux, clinical psychologist, to set up an appointment.  She can be reached at [phone].

## 2011-12-20 ENCOUNTER — Telehealth: Payer: Self-pay | Admitting: Family Medicine

## 2011-12-20 LAB — BASIC METABOLIC PANEL
Potassium: 3.8 mEq/L (ref 3.5–5.3)
Sodium: 139 mEq/L (ref 135–145)

## 2011-12-20 LAB — CBC
MCHC: 32.3 g/dL (ref 30.0–36.0)
RDW: 18.2 % — ABNORMAL HIGH (ref 11.5–15.5)

## 2011-12-20 LAB — TSH: TSH: 0.654 u[IU]/mL (ref 0.350–4.500)

## 2011-12-20 NOTE — Telephone Encounter (Signed)
Left message TSH ok.  Recommend she take daily women's multivitamin for mild anemia.

## 2011-12-28 ENCOUNTER — Ambulatory Visit (INDEPENDENT_AMBULATORY_CARE_PROVIDER_SITE_OTHER): Payer: No Typology Code available for payment source | Admitting: Family Medicine

## 2011-12-28 ENCOUNTER — Encounter: Payer: Self-pay | Admitting: Family Medicine

## 2011-12-28 VITALS — BP 120/80 | HR 67 | Temp 99.1°F | Ht 69.0 in | Wt 183.9 lb

## 2011-12-28 DIAGNOSIS — M79645 Pain in left finger(s): Secondary | ICD-10-CM

## 2011-12-28 DIAGNOSIS — M79646 Pain in unspecified finger(s): Secondary | ICD-10-CM | POA: Insufficient documentation

## 2011-12-28 DIAGNOSIS — M79609 Pain in unspecified limb: Secondary | ICD-10-CM

## 2011-12-28 DIAGNOSIS — F419 Anxiety disorder, unspecified: Secondary | ICD-10-CM

## 2011-12-28 DIAGNOSIS — F411 Generalized anxiety disorder: Secondary | ICD-10-CM

## 2011-12-28 NOTE — Patient Instructions (Addendum)
You have the tools to help with your stress  Keep doing deep breathing  Consider adding daily exercise  Please contact Dr. Pascal Lux, clinical psychologist, to set up an appointment.  She can be reached at [phone].    Try Aspercreme for your finger if icy hot doesn't help  Follow-up with your primary doctor as needed

## 2011-12-28 NOTE — Progress Notes (Signed)
  Subjective:    Patient ID: Elizabeth Hines, female    DOB: 07-May-1985, 26 y.o.   MRN: [id number]  HPI Here for follow-up panic attack and has questions about finger pain  Panic attack:  Has been stable, perhaps slightly decreased in frequency.  Feels once the holidays are over, she will feel much better.  Had one several night ago at midnight, resolved with deep breathing.  Has not initiated exercise, has been talking to someone about stress reduction- but has not had time to do so lately.  Left index finger pain:  insidious onset- no injury, at [facility].  NO swelling, locking, catching.  Right hand dominant, no overuse.  Review of Systems See HPI    Objective:   Physical Exam  GEN: NAD MSK;  Left index finger.  No effusion, synovitis, erythema, swelling. Full ROM/      Assessment & Plan:

## 2011-12-28 NOTE — Assessment & Plan Note (Signed)
Reassured patient about her coping skills, offered additional tips.  Overall stable, and expect to improve after stressful holiday season.  Will not increase buspar as her current dose has been titrated to avoid dizziness at previous higher dose.  Follow-up with PCP.

## 2011-12-28 NOTE — Assessment & Plan Note (Signed)
No injury, normal exam.  Reassured likely strain.  Advised aspercreme.

## 2012-01-01 ENCOUNTER — Telehealth: Payer: Self-pay | Admitting: Sports Medicine

## 2012-01-01 NOTE — Telephone Encounter (Signed)
Patient wanted to make sure that the multivitamin she has bought and is going to take daily would not interfere with Buspar. Assured patient that this is fine to take together.

## 2012-01-01 NOTE — Telephone Encounter (Signed)
Patient is taking chewable multivitamins as suggested by Dr. Earnest Bailey and she wants to be sure they won't interact with her current medications.

## 2012-01-25 ENCOUNTER — Ambulatory Visit (INDEPENDENT_AMBULATORY_CARE_PROVIDER_SITE_OTHER): Payer: No Typology Code available for payment source | Admitting: Sports Medicine

## 2012-01-25 ENCOUNTER — Encounter: Payer: Self-pay | Admitting: Sports Medicine

## 2012-01-25 VITALS — BP 122/56 | HR 60 | Temp 98.8°F | Ht 69.0 in | Wt 183.5 lb

## 2012-01-25 DIAGNOSIS — F411 Generalized anxiety disorder: Secondary | ICD-10-CM

## 2012-01-25 DIAGNOSIS — F419 Anxiety disorder, unspecified: Secondary | ICD-10-CM

## 2012-01-25 MED ORDER — BUSPIRONE HCL 10 MG PO TABS: 10.0000 mg | ORAL_TABLET | Freq: Every day | ORAL | Status: DC

## 2012-01-25 MED ORDER — BUSPIRONE HCL 15 MG PO TABS: 15.0000 mg | ORAL_TABLET | Freq: Every day | ORAL | Status: DC

## 2012-01-25 NOTE — Assessment & Plan Note (Signed)
Currently Stable, No side effects, will try to improve dosing intervals as missing doses Refilled 15s and 10s Reassured about coping strategies; continue conseling

## 2012-01-25 NOTE — Patient Instructions (Addendum)
It was nice to see you today.   Today we discussed: 1. Chronic anxiety  I have refilled your medications.  You are doing well with your coping skills  Keep up with your counseling  Keep practicing your relaxation techniques    Please plan to return to see me at the end of Feburary.  If you need anything prior to seeing me please call the clinic.  Please Bring all medications with you to each appointment.

## 2012-01-25 NOTE — Progress Notes (Signed)
  [facility]  Patient name: Elizabeth Hines MRN [id number]  Date of birth: [birth year]  CC & HPI:  Elizabeth Hines is a 27 y.o. female presenting today for CPE:  # reports chronic anxiety continues to remain stable on Buspar.  Still having decreased adherence with medications but reports overall doing well.  Occasional episodes of panic with 1 occurrence of calling EMS but declined transport as reports became panicked due to increased awareness of her heart beat for 10-15 seconds.  Panic subsided when EMS arrived.  No chest pain, other palpitations, dizziness, light headedness, dyspnea.  Has not continued with counseling but plans to call again in future.   ROS:  Denies, chest pain, shortness of breath.  Pertinent History Reviewed:  Medical & Surgical Hx:  Reviewed: Significant for Chronic Anxiety, Hx of CIN I, Medications: Reviewed & Updated - see associated section Social History: Reviewed -  reports that she has never smoked. She does not have any smokeless tobacco history on file.  Objective Findings:  Vitals:  Filed Vitals:   01/25/12 0856  BP: 122/56  Pulse: 60  Temp: 98.8 F (37.1 C)    PE: GENERAL:  Adult AA female. In no discomfort; no respiratory distress. PSYCH: Alert and appropriately interactive; Insight:Good   H&N: AT/[place], trachea midline EENT:  MMM, no scleral icterus, EOMi HEART: RRR, S1/S2 heard, no murmur LUNGS: CTA B, no wheezes, no crackles EXTREMITIES: Moves all 4 extremities spontaneously, warm well perfused, no edema     Assessment & Plan:

## 2012-01-26 ENCOUNTER — Telehealth: Payer: Self-pay | Admitting: *Deleted

## 2012-01-26 NOTE — Telephone Encounter (Signed)
Received RX request for Buspirone 15 mg one every 12 hours.  Different directions on med list.  Placed in  Dr. Janeece Riggers box.

## 2012-01-29 NOTE — Telephone Encounter (Signed)
Sent in refills for both 10 and 15 mg during last visit

## 2012-02-21 ENCOUNTER — Encounter: Payer: Self-pay | Admitting: Sports Medicine

## 2012-02-21 ENCOUNTER — Ambulatory Visit (INDEPENDENT_AMBULATORY_CARE_PROVIDER_SITE_OTHER): Payer: No Typology Code available for payment source | Admitting: Sports Medicine

## 2012-02-21 VITALS — BP 119/71 | HR 66 | Temp 98.5°F | Ht 69.0 in | Wt 184.5 lb

## 2012-02-21 DIAGNOSIS — M79609 Pain in unspecified limb: Secondary | ICD-10-CM

## 2012-02-21 DIAGNOSIS — M79645 Pain in left finger(s): Secondary | ICD-10-CM

## 2012-02-21 NOTE — Progress Notes (Signed)
  [facility]  Patient name: Elizabeth Hines MRN [id number]  Date of birth: 1985/06/07  CC & HPI:  Elizabeth Hines is a 27 y.o. female presenting today for evaluation of B hand swelling:  Reports has never happened before. Located on L 4th finger and R 3rd finger.  Mainly distrubed over PIP B.   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  ROS:  No fevers, chills, rashes, large joint aches, heades  Pertinent History Reviewed:  Medical & Surgical Hx:  Reviewed: Significant for Anxiety.  Upon further chart review pt with hx of hand pain from back in december Medications: Reviewed & Updated - see associated section Social History: Reviewed -  reports that she has never smoked. She does not have any smokeless tobacco history on file.   Objective Findings:  Vitals:  Filed Vitals:   02/21/12 1349  BP: 119/71  Pulse: 66  Temp: 98.5 F (36.9 C)    PE: GENERAL:  Adult AA female. In no discomfort; no respiratory distress. PSYCH: alert and appropriate, not anxious appearing MSK:  L 4th finger and R 3rd, with minimal swelling, no erythema, no joint deformity.  Full ROM, strength of B hands 5+/5 in grip and finger flexion and extension Skin: no rashes noted on arms or back   Assessment & Plan:

## 2012-02-21 NOTE — Assessment & Plan Note (Addendum)
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 Will try systemic treatment with OTC aleve. >consider Rheumatolic workup given, longevity of symptoms (although pt denies prior problems with this), distribution of symptoms (MP and PIP) and morning stiffness

## 2012-02-21 NOTE — Patient Instructions (Addendum)
It was good to see you.  Try taking Aleeve (Naproxen) 2 pills (220mg  each) twice a day.  I recommend you do this for 2 weeks.  If not improved after this or if it returns please return to see me as needed.  Follow up at the end of Feb as previously scheduled   Finger Sprain A finger sprain is a tear in one of the strong, fibrous tissues that connect the bones (ligaments) in your finger. The severity of the sprain depends on how much of the ligament is torn. The tear can be either partial or complete. CAUSES  Often, sprains are a result of a fall or accident. If you extend your hands to catch an object or to protect yourself, the force of the impact causes the fibers of your ligament to stretch too much. This excess tension causes the fibers of your ligament to tear. SYMPTOMS  You may have some loss of motion in your finger. Other symptoms include:  Bruising.  Tenderness.  Swelling. DIAGNOSIS  In order to diagnose finger sprain, your caregiver will physically examine your finger or thumb to determine how torn the ligament is. Your caregiver may also suggest an X-ray exam of your finger to make sure no bones are broken. TREATMENT  If your ligament is only partially torn, treatment usually involves keeping the finger in a fixed position (immobilization) for a short period. To do this, your caregiver will apply a bandage, cast, or splint to keep your finger from moving until it heals. For a partially torn ligament, the healing process usually takes 2 to 3 weeks. If your ligament is completely torn, you may need surgery to reconnect the ligament to the bone. After surgery a cast or splint will be applied and will need to stay on your finger or thumb for 4 to 6 weeks while your ligament heals. HOME CARE INSTRUCTIONS  Keep your injured finger elevated, when possible, to decrease swelling.  To ease pain and swelling, apply ice to your joint twice a day, for 2 to 3 days:  Put ice in a plastic  bag.  Place a towel between your skin and the bag.  Leave the ice on for 15 minutes.  Only take over-the-counter or prescription medicine for pain as directed by your caregiver.  Do not wear rings on your injured finger.  Do not leave your finger unprotected until pain and stiffness go away (usually 3 to 4 weeks).  Do not allow your cast or splint to get wet. Cover your cast or splint with a plastic bag when you shower or bathe. Do not swim.  Your caregiver may suggest special exercises for you to do during your recovery to prevent or limit permanent stiffness. SEEK IMMEDIATE MEDICAL CARE IF:  Your cast or splint becomes damaged.  Your pain becomes worse rather than better. MAKE SURE YOU:  Understand these instructions.  Will watch your condition.  Will get help right away if you are not doing well or get worse. Document Released: 02/17/2004 Document Revised: 04/03/2011 Document Reviewed: 09/12/2010 [facility] Patient Information 2013 [place], [place].

## 2012-03-14 ENCOUNTER — Ambulatory Visit (INDEPENDENT_AMBULATORY_CARE_PROVIDER_SITE_OTHER): Payer: No Typology Code available for payment source | Admitting: Sports Medicine

## 2012-03-14 ENCOUNTER — Encounter: Payer: Self-pay | Admitting: Sports Medicine

## 2012-03-14 VITALS — BP 118/50 | HR 79 | Temp 98.6°F | Ht 69.0 in | Wt 189.0 lb

## 2012-03-14 DIAGNOSIS — M79646 Pain in unspecified finger(s): Secondary | ICD-10-CM

## 2012-03-14 NOTE — Patient Instructions (Signed)
It was good to see you.  If you want to try to stop your buspar that is okay . . . Otherwise go to the 10mg  tablets twice a day.  For your face, Try "Benzoyl Peroxide" 3-4 Times per week.  Use a white washcloth and towel because it can stain your linens.  I will see you back in April

## 2012-03-18 NOTE — Assessment & Plan Note (Signed)
Improved, no residual noted on exam

## 2012-03-18 NOTE — Assessment & Plan Note (Signed)
Doing well, Does not want to stop yet but offered that option given doing well on 3/14 doses Will decrease to 10mg  tabs as previously doing but feel this is more placebo effect Encouraged to continue working with coping mechanisms and counseling, pt doing well

## 2012-03-18 NOTE — Progress Notes (Signed)
  [facility]  Patient name: Elizabeth Hines MRN [id number]  Date of birth: 1986-01-18  CC & HPI:  Elizabeth Hines is a 27 y.o. female presenting today for CPE:  # reports chronic anxiety continues to remain stable on Buspar.  Still having decreased adherence with medications only taking the 15mg  table ~3/14 doses per week.  Reporting worsening dizziness with 15mg  does.  Wants to decreased to 10.  Otherwise stress level and coping skills significantly improved from starting meds last year.  Not ready to stop  ROS:  Denies, chest pain, shortness of breath.  Pertinent History Reviewed:  Medical & Surgical Hx:  Reviewed: Significant for Chronic Anxiety, Hx of CIN I, Medications: Reviewed & Updated - see associated section Social History: Reviewed -  reports that she has never smoked. She does not have any smokeless tobacco history on file.  Objective Findings:  Vitals:  Filed Vitals:   03/14/12 1557  BP: 118/50  Pulse: 79  Temp: 98.6 F (37 C)    PE: GENERAL:  Adult AA female. In no discomfort; no respiratory distress. PSYCH: Alert and appropriately interactive; Insight:GoodDoes not appear anxious   H&N: AT/[place], trachea midline EENT:  MMM, no scleral icterus, EOMi HEART: RRR, S1/S2 heard, no murmur LUNGS: CTA B, no wheezes, no crackles EXTREMITIES: Moves all 4 extremities spontaneously, warm well perfused, no edema in UE or LE     Assessment & Plan:

## 2012-03-21 ENCOUNTER — Telehealth: Payer: Self-pay | Admitting: Sports Medicine

## 2012-03-21 NOTE — Telephone Encounter (Signed)
Pt is calling to let Dr Berline Chough know that she wants to stay on the 10 & 15 Buspar - she doesn't want to decrease this.  Would like to speak to him about this

## 2012-04-02 ENCOUNTER — Telehealth: Payer: Self-pay | Admitting: Sports Medicine

## 2012-04-02 MED ORDER — BUSPIRONE HCL 10 MG PO TABS: 10.0000 mg | ORAL_TABLET | Freq: Every day | ORAL | Status: DC

## 2012-04-02 MED ORDER — BUSPIRONE HCL 15 MG PO TABS: 15.0000 mg | ORAL_TABLET | Freq: Every day | ORAL | Status: DC

## 2012-04-02 NOTE — Telephone Encounter (Signed)
rx printed and placed in to be faxed with coversheet

## 2012-04-02 NOTE — Telephone Encounter (Signed)
Patient is calling asking if Dr. Berline Chough sent her Rx for Buspar to [facility] because they are less expensive at the [facility].  It looks like they were sent to [facility] so she would like that fixed.  The [facility] for [facility] is [phone].

## 2012-04-09 ENCOUNTER — Ambulatory Visit (INDEPENDENT_AMBULATORY_CARE_PROVIDER_SITE_OTHER): Payer: Self-pay | Admitting: Family Medicine

## 2012-04-09 VITALS — BP 130/73 | HR 61 | Temp 98.6°F | Ht 69.0 in | Wt 188.0 lb

## 2012-04-09 DIAGNOSIS — R202 Paresthesia of skin: Secondary | ICD-10-CM

## 2012-04-09 DIAGNOSIS — F411 Generalized anxiety disorder: Secondary | ICD-10-CM

## 2012-04-09 HISTORY — DX: Paresthesia of skin: R20.2

## 2012-04-09 NOTE — Progress Notes (Signed)
  Subjective:    Patient ID: Elizabeth Hines, female    DOB: Sep 23, 1985, 27 y.o.   MRN: [id number]  HPI: Pt presents for same-day appointment for numbness and tingling in her arms, worse on the right. Pt describes two ~5-minute episodes of numbness and tingling in her hands, once on Saturday ~2 PM when she was getting off work (3 days ago), and once last night. Pt describes decreased sensation and an involuntary "drawing up" of her hands during the episodes (pt slightly flexed fingers and elbows describing the sensation/movement). The sensation distrubution is described as from pt's "elbows down into her hands and fingers." The episodes resolved with gentle massage of her hands/arms and "working her arms out" (light stretching/ROM movements until her sensation returned). She also describes a vague sensation of "her head tightening up." Both episodes occurred around the time of getting off work. Pt states no episodes have occurred prior to these two and she does not currently have numbness or tingling. She denies trauma to her neck or either arm. She does describe some vague left collarbone pain around the same time, but does not know if it is related or not.  The episodes were not painful but were distressing; pt has a hx of anxiety, currently on Buspar 15 mg (previously had thought to reduce to 10 mg but states she decided to "stay on 15"). Pt believes anxiety may be contributing to her symptoms but she 'isn't sure.' She describes some increased stress at work (taking on more hours with co-workers quitting, etc), as well as financial stress. She denies palpitations, chest pain, SOB, or diaphoresis/nervousness during the episodes or otherwise, currently.  Review of Systems: As above. Otherwise denies fever/chills, N/V, change in bowel/bladder habits.     Objective:   Physical Exam BP 130/73  Pulse 61  Temp(Src) 98.6 F (37 C) (Oral)  Ht 5\' 9"  (1.753 m)  Wt 188 lb (85.276 kg)  BMI 27.75 kg/m2 Gen:  young adult female in NAD, pleasant and cooperative HEENT: [place]/AT, EOMI, MMM, TMs clear bilaterally, bilateral conjunctivae and gingivae clear, no paleness  No sinus tenderness, normal nasal and oropharyngeal mucosae Pulm: CTAB, no wheezes, normal WOB Cardio: RRR, no murmur appreciated Neuro/MSK: mild RIGHT shoulder point tenderness to acromioclavicular joint  Mild RIGHT shoulder pain on full arm extension and internal rotation at shoulder against downward resistance  No bony tenderness of bilateral clavicles or scapular spines  Normal ROM of neck passively and actively  Normal/equal ROM in all four extremities; mild pain on right at shoulder, as above;  Biceps and triceps DTR's present and equal bilaterally  No paraspinal muscle tenderness or spasm, nor bony tenderness in cervical, thoracic, or lumbar spine  Normal sensation bilaterally to hands, forearms, and upper arms Ext: moves all extremities equally, warm/well-perfused, distal pulses intact/symmetric, no clubbing/cyanosis/edema     Assessment & Plan:

## 2012-04-09 NOTE — Assessment & Plan Note (Signed)
A: Not main concern of today's visit, though anxiety/life stressors are likely contributing to current arm/hand symptoms, and/or current symptoms are distressing and contributing to anxiety. Overall, pt's anxiety seems relatively stable on Buspar. Currently still taking 15 mg despite recent notes indicating discussion/decision to reduce to 10 mg.  P: Continue Buspar 15 mg daily. Advised pt to reduce stress as much as possible ("leave work at work" counseling, Catering manager); discussed breathing exercises and ways to "keep her mind off things." Also recommended following up with PCP Dr. Berline Chough as needed and to discuss any further changes to Buspar therapy.

## 2012-04-09 NOTE — Assessment & Plan Note (Signed)
A: Uncertain cause; no definite trauma or prior occurance. Exam today shows normal ROM of neck and bilateral upper extremities with otherwise normal MSK/neurological exam, with the exception of some mild right shoulder pain/tenderness with direct palpation/provocation (?acromioclavicular joint). Possibility exists of spinal cord syrinx given bilaterality of symptoms, but pt has a focal finding on the right and complains more of symptoms on the right, and distribution is described as "elbows down into hands." Regardless, there is likely a contribution of anxiety, as well (pt identifies work and other financial stressors).  P: Advised pt to rest right arm/shoulder as much as possible and discussed NSAIDs and ice therapy. Also advised reduction of stress and control of anxiety as much as possible. Instructed pt to follow up with PCP Dr. Berline Chough in 1-2 weeks if symptoms continue/recur or worsen. Could consider neck/shoulder/arm imaging and/or lab work-up at that time (?vitamin B12, etc); discussed with pt who agrees to try conservative measures before pursuing further work-up.

## 2012-04-09 NOTE — Patient Instructions (Signed)
Thank you for coming in, today! It was nice to meet you. I think the numbness/tingling in your arm could be from a pinched nerve. I think anxiety is playing a role, too. I would suggest trying to reduce stress as much as possible.    Take breaks at work if possible. Try deep, slow breathing exercises.    When you're not at work, try your best not to think or worry about work or hours. Rest your right arm when you are able.    Try using ice in a plastic bag wrapped in a towel, held to your shoulder (15 minutes on, 15 off, for an hour at a time).    Aleve (naproxyn) is a good medication to try as well. You can take 2 in the morning and 2 in the evening. It is similar to Motrin but lasts longer. Keep taking your Buspar. I will make a note that you're still taking the 15 mg tablets. If you are still having trouble in another week to two weeks, make an appointment to come back to see Dr. Berline Chough. We might consider some lab work and/or xrays, at that point. Please call with any questions or concerns at any time. --Dr. Casper Harrison

## 2012-05-03 ENCOUNTER — Encounter: Payer: Self-pay | Admitting: Sports Medicine

## 2012-05-03 ENCOUNTER — Ambulatory Visit (INDEPENDENT_AMBULATORY_CARE_PROVIDER_SITE_OTHER): Payer: Medicaid Other | Admitting: Sports Medicine

## 2012-05-03 VITALS — BP 118/70 | HR 64 | Temp 98.9°F | Ht 69.5 in | Wt 189.2 lb

## 2012-05-03 DIAGNOSIS — E663 Overweight: Secondary | ICD-10-CM

## 2012-05-03 DIAGNOSIS — F419 Anxiety disorder, unspecified: Secondary | ICD-10-CM

## 2012-05-03 DIAGNOSIS — Z6825 Body mass index (BMI) 25.0-25.9, adult: Secondary | ICD-10-CM

## 2012-05-03 DIAGNOSIS — E0590 Thyrotoxicosis, unspecified without thyrotoxic crisis or storm: Secondary | ICD-10-CM

## 2012-05-03 DIAGNOSIS — R002 Palpitations: Secondary | ICD-10-CM

## 2012-05-03 DIAGNOSIS — F411 Generalized anxiety disorder: Secondary | ICD-10-CM

## 2012-05-03 LAB — TSH: TSH: 1.567 u[IU]/mL (ref 0.350–4.500)

## 2012-05-03 LAB — T4, FREE: Free T4: 1.09 ng/dL (ref 0.80–1.80)

## 2012-05-03 NOTE — Progress Notes (Signed)
Subjective:     Patient ID: Elizabeth Hines, female   DOB: September 13, 1985, 27 y.o.   MRN: [id number]  HPI Elizabeth Hines is a 27-y.o. female who presents for medication follow-up and palpitations.  1. Medication follow-up: Continues to take Buspar 10 mg for chronic anxiety symptoms.  States that she feels like she is "doing better" on the medication.  She is trying to be more consistent about taking the medication twice a day and feels like she is doing a better job with being compliant.  Reports 12/14 doses in past week. Continues to have occasional dizziness, especially if she takes medication without eating. Notes that she thought about stopping the medication, but does not yet feel ready.  She is feeling stressed at work, but has good coping and stress management skills.  2. Palpitations: Notes random sensations of "fluttering" in her chest for the past 5 months.  Feels like her "heart skips a beat".  Episodes only last for a few seconds and occur 2 or 3 times per week.  Denies associated chest pain or dyspnea.  No noticeable increase in symptoms with exertion.  No history of heart problems.  3. weight gain: Patient reports a overall stable weight at this time.  She does continue to have difficulty with attempting weight loss.  She has tried dietary modifications as well as increased activity without success.  Review of Systems Negative except as noted in HPI.    Objective:   Physical Exam  Vitals reviewed. Constitutional: She is oriented to person, place, and time. She appears well-developed and well-nourished. No distress.  HENT:  Head: Normocephalic and atraumatic.  Mouth/Throat: Oropharynx is clear and moist.  Mildly scalloped tongue on inspection.  Eyes: Conjunctivae and EOM are normal. Pupils are equal, round, and reactive to light.  Neck: Neck supple.  Cardiovascular: Normal rate and normal heart sounds.   No murmur heard. Regular rhythm with occasional PVC.  Pulmonary/Chest: Effort  normal and breath sounds normal. No respiratory distress.  Neurological: She is alert and oriented to person, place, and time.  Skin: Skin is warm and dry.  Psychiatric: She has a normal mood and affect. Her behavior is normal.       Assessment:     1. Chronic anxiety 2. Palpitations    Plan:     1. Chronic anxiety - Continue Buspar 10mg  and 15mg  daily.  Continue working with coping mechanisms and stress management.  Follow up in 6 weeks.  2. Palpitations - Likely benign and secondary to PVCs, though may be secondary to thyroid.  No history of heart disease.  Labs drawn for thyroid levels today.  3. obesity: Will recheck thyroid labs but of re\re encouraged increased activity and provided dietary sheet once again.    Dictated by Lonzo Candy, MS4.

## 2012-05-03 NOTE — Progress Notes (Deleted)
  [facility]  Patient name: Elizabeth Hines MRN [id number]  Date of birth: [birth year]  CC & HPI:  Elizabeth Hines is a 27 y.o. female presenting today for follow up of:  ***  ------------------------------------------------------------------------------------------------------------------ Medication Compliance: {Desc; compliance:5303::"compliant most of the time"}  Diet Compliance: {Desc; compliance:5303::"compliant most of the time"} ------------------------------------------------------------------------------------------------------------------ New Concerns:  ***   ROS:  PER HPI  Pertinent History Reviewed:  Medical & Surgical Hx:  Reviewed: Significant for *** Medications: Reviewed & Updated - See associated section in EMR Social History: Reviewed -  reports that she has never smoked. She does not have any smokeless tobacco history on file.   Objective Findings:  Vitals: BP 118/70  Pulse 64  Temp(Src) 98.9 F (37.2 C) (Oral)  Ht 5' 9.5" (1.765 m)  Wt 189 lb 3.2 oz (85.821 kg)  BMI 27.55 kg/m2  ***  Assessment & Plan:

## 2012-05-03 NOTE — Assessment & Plan Note (Signed)
Recheck thyroid Discussed weight loss and nutrition

## 2012-05-03 NOTE — Assessment & Plan Note (Signed)
Stable/Well Controlled - no changes at this time. 

## 2012-05-03 NOTE — Assessment & Plan Note (Signed)
Low TSH previously. Some issues with palpitations and weight gain Recheck TSH and free t4

## 2012-05-03 NOTE — Patient Instructions (Addendum)
It was nice to see you today.   Today we discussed: 1. Hyperthyroidism We are checking some labs today - TSH - T4, free  2. Overweight (BMI 25.0-29.9) Continue to work on your weight loss  3. Chronic anxiety Keep up your Buspar doses.  Good job on taking them more consistently   Please plan to return to see me at the end of May.  If you need anything prior to seeing me please call the clinic.  Please Bring all medications with you to each appointment.

## 2012-05-03 NOTE — Assessment & Plan Note (Signed)
Recurrent once again.  Likely stress associated still.  Having no symptoms with this and no exertional symptoms.  reporting this is more of a increased awareness of occasional skipped beat.  Likely PVCs.  Discussed benign nature of this. Recheck of TSH and free T4 given history of abnormal TSH previously.

## 2012-05-07 ENCOUNTER — Telehealth: Payer: Self-pay | Admitting: *Deleted

## 2012-05-07 NOTE — Telephone Encounter (Signed)
Message copied by Farrell Ours on Tue May 07, 2012  9:13 AM ------      Message from: Gaspar Bidding D      Created: Mon May 06, 2012  7:23 PM       Please call and inform of normal Thyroid Function      No further testing indicated ------

## 2012-05-07 NOTE — Telephone Encounter (Signed)
LVM for patient to call back. ?

## 2012-05-08 NOTE — Telephone Encounter (Signed)
d 

## 2012-05-08 NOTE — Telephone Encounter (Signed)
Left message on voicemail. Elizabeth Hines  

## 2012-05-15 ENCOUNTER — Encounter: Payer: Self-pay | Admitting: Family Medicine

## 2012-05-15 ENCOUNTER — Ambulatory Visit (INDEPENDENT_AMBULATORY_CARE_PROVIDER_SITE_OTHER): Payer: Self-pay | Admitting: Family Medicine

## 2012-05-15 VITALS — BP 114/81 | HR 72 | Temp 98.1°F | Ht 69.5 in | Wt 188.0 lb

## 2012-05-15 DIAGNOSIS — R42 Dizziness and giddiness: Secondary | ICD-10-CM

## 2012-05-15 NOTE — Progress Notes (Signed)
  Subjective:    Patient ID: Elizabeth Hines, female    DOB: 07-29-1985, 27 y.o.   MRN: [id number]  HPI  Same day appointment for dizziness  Dizziness:  First episode was on second day of menses about 2 weeks ago, while in bed "room was spinning" Second episode was at work: room was spinning. Improved after sitting down.  Reported lasting 5-10  minutes   On further questioning, reports feels vague symptoms of dizziness from time to time but is concerned it is now due to her period.  Period was no heavier than usual.  Says has a mild headache with second episode of dizziness, which quickly resolved.    Does not reports dyspnea, or palpations.  No history of this being orthostatic in nature.  Does not seem to be triggered by change in direction/movements.  Again asks if dizziness is period related or hereditary as her mom had dizziness with her periods.  I have reviewed patient's  PMH, FH, and Social history and Medications as related to this visit. Anxiety Review of Systems See HPI    Objective:   Physical Exam GEN: Alert & Oriented, No acute distress.  Not currently dizzy today, unable to elicit CV:  Regular Rate & Rhythm, no murmur Respiratory:  Normal work of breathing, CTAB Neuro:  Normal gait        Assessment & Plan:

## 2012-05-15 NOTE — Assessment & Plan Note (Signed)
Mild anemia noted today .  Reports taking daily multivitamin.  Will supplement with daily iron tablet.  Will recheck at follow-up with PCP

## 2012-05-15 NOTE — Patient Instructions (Addendum)
Buy iron tablet 325 mg over the counter and take on every day until you see Dr. Berline Chough again.  You can ask pharmacists to help you find it if needed.  Keep taking daily multivitamin.

## 2012-06-11 ENCOUNTER — Ambulatory Visit (INDEPENDENT_AMBULATORY_CARE_PROVIDER_SITE_OTHER): Payer: No Typology Code available for payment source | Admitting: Sports Medicine

## 2012-06-11 ENCOUNTER — Encounter: Payer: Self-pay | Admitting: Sports Medicine

## 2012-06-11 VITALS — BP 109/59 | HR 72 | Temp 98.5°F | Ht 69.0 in | Wt 190.0 lb

## 2012-06-11 DIAGNOSIS — J309 Allergic rhinitis, unspecified: Secondary | ICD-10-CM

## 2012-06-11 DIAGNOSIS — D509 Iron deficiency anemia, unspecified: Secondary | ICD-10-CM | POA: Insufficient documentation

## 2012-06-11 DIAGNOSIS — F411 Generalized anxiety disorder: Secondary | ICD-10-CM

## 2012-06-11 DIAGNOSIS — R002 Palpitations: Secondary | ICD-10-CM

## 2012-06-11 DIAGNOSIS — D649 Anemia, unspecified: Secondary | ICD-10-CM

## 2012-06-11 DIAGNOSIS — R42 Dizziness and giddiness: Secondary | ICD-10-CM

## 2012-06-11 DIAGNOSIS — F419 Anxiety disorder, unspecified: Secondary | ICD-10-CM

## 2012-06-11 HISTORY — DX: Anemia, unspecified: D64.9

## 2012-06-11 HISTORY — DX: Allergic rhinitis, unspecified: J30.9

## 2012-06-11 LAB — CBC
MCH: 24.5 pg — ABNORMAL LOW (ref 26.0–34.0)
Platelets: 228 10*3/uL (ref 150–400)
RBC: 4.73 MIL/uL (ref 3.87–5.11)
WBC: 4.9 10*3/uL (ref 4.0–10.5)

## 2012-06-11 LAB — FERRITIN: Ferritin: 5 ng/mL — ABNORMAL LOW (ref 10–291)

## 2012-06-11 MED ORDER — FLUTICASONE PROPIONATE 50 MCG/ACT NA SUSP: 2.0000 | Freq: Every day | NASAL | Status: DC

## 2012-06-11 NOTE — Patient Instructions (Addendum)
It was good to see you.  Try the foot support provided.  It should be better in about one week.  If not we can consider an injection to help combat and irritation of the nerve.  He also try to soak his foot in a cool bucket of water.  Please follow up in July for your chronic anxiety.  He continued to have any funny heartbeats that last more than 10-15 seconds please come in to be evaluated.  We're checking lab work today

## 2012-06-11 NOTE — Assessment & Plan Note (Signed)
flonase

## 2012-06-14 ENCOUNTER — Telehealth: Payer: Self-pay | Admitting: Sports Medicine

## 2012-06-14 MED ORDER — INTEGRA PLUS PO CAPS: 1.0000 | ORAL_CAPSULE | Freq: Every day | ORAL | Status: DC

## 2012-06-14 NOTE — Telephone Encounter (Signed)
Wants to change for Flonase to Claritin D wants to know if it will affect her Buspar

## 2012-06-14 NOTE — Telephone Encounter (Signed)
If the patient is having worsening symptoms it is okay if she take Claritin-D for a short course.  I would like for her to continue her Flonase either way.  The patient absolutely needs to be on an iron supplement.  I recommend that she purchased "INTEGRA Plus" which is prescription strength iron that can be purchased over-the-counter.  I believe it is least expensive at [facility] and should beapproximately 15 dollars for a 30 day supply.  Take one tablet daily. This medication is usually the best tolerated of all the iron supplements.  Please call pt and inform.

## 2012-06-14 NOTE — Telephone Encounter (Signed)
Returned call to patient.  Patient is on way to pharmacy to purchase Claritin-D and wants to know if she can take this with Buspar.  Informed patient to check with pharmacist before she purchases med and they can review her med list for any interactions.  Patient agreeable.  Patient had recent labs and wants to know if she needs to restart iron.  If so, she would like a different iron Rx because the last one "was hurting my chest."  Will route note to Dr. Berline Chough for advice and call patient back.  Patient aware that Dr. Berline Chough not in office today and may not receive call back until Tuesday.  Gaylene Brooks, RN

## 2012-06-14 NOTE — Telephone Encounter (Signed)
Returned call to patient.  Purchased Claritin-D #10 tabs from pharmacy today.  Will take this amount and continue with Flonase per Dr. Janeece Riggers request.  Informed to purchase INTEGRA Plus (OTC) and patient agreeable. Gaylene Brooks, RN

## 2012-06-18 ENCOUNTER — Telehealth: Payer: Self-pay | Admitting: Sports Medicine

## 2012-06-18 NOTE — Telephone Encounter (Signed)
Instructed patient Elizabeth Hines 2 puffs each nostril once a day and to continue Elizabeth Hines until her next OV and discuss results with Dr. Berline Chough.  Pt verbalized understanding.   Patient also wanted to let Dr. Berline Chough know that she stopped taking the Claritin-D on  06/14/2012.  Pt c/o loss of appetite and dizziness with this medication.  Marcine Gadway, Darlyne Russian, CMA

## 2012-06-18 NOTE — Telephone Encounter (Signed)
Thank you for clarifying with patient.  Agree with stopping Claritin-D.

## 2012-06-18 NOTE — Telephone Encounter (Signed)
Pt has question about flonase and how often to use it. Please advise

## 2012-06-19 NOTE — Progress Notes (Signed)
  [facility]  Patient name: Elizabeth Hines MRN [id number]  Date of birth: 02-Apr-1985  CC & HPI:  Elizabeth Hines is a 27 y.o. female presenting today for follow up of:  # Chronic Anxiety: Reports having one anxiety attack, but was able to work her with her breathing and coping strategies from psychology.  Reports occasional palpitations that last for <5 seconds.    Denies any dizziness or orthostasis.  #Allergies: Reports she has had some continued stuffiness and congestion.  No rhinorrhea, no fevers no chills,.  Has not been taking clinic.  # Anemia: Patient reports mental she was anemic.  Tried over-the-counter iron but caused GI upset.  ------------------------------------------------------------------------------------------------------------------ Medication Compliance: compliant most of the time  Diet Compliance: compliant most of the time ------------------------------------------------------------------------------------------------------------------ New Concerns:  # Foot pain: Patient reports she is having right sided foot pain described as a burning on the distal mid foot.  Has tried over-the-counter insoles in her work shoes.  Worse after standing for prolonged period.   ROS:  PER HPI  Pertinent History Reviewed:  Medical & Surgical Hx:  Reviewed: Significant for Anxiety Medications: Reviewed & Updated - See associated section in EMR Social History: Reviewed -  reports that she has never smoked. She does not have any smokeless tobacco history on file.   Objective Findings:  Vitals: BP 109/59  Pulse 72  Temp(Src) 98.5 F (36.9 C) (Oral)  Ht 5\' 9"  (1.753 m)  Wt 190 lb (86.183 kg)  BMI 28.05 kg/m2  LMP 05/03/2012  PE: GENERAL:  Adult AA  female. In no discomfort; no respiratory distress. PSYCH: Alert and appropriately interactive; Insight:Good.  Reports appropriate coping strategies. H&N: AT/[place], trachea midline EENT:  MMM, no scleral icterus, EOMi, nasal pallor,  nasal congestion.  No maxillary/frontal tenderness HEART: RRR, S1/S2 heard, no murmur LUNGS: CTA B, no wheezes, no crackles EXTREMITIES: Moves all 4 extremities spontaneously, warm well perfused, no edema, bilateral DP and PT pulses 2/4.  Tenderness palpation between the third and fourth metatarsals on the right.  Positive squeeze tests.  Positive splay toe with standing bilaterally.   Assessment & Plan:

## 2012-06-19 NOTE — Assessment & Plan Note (Signed)
Continues to have some palpitations but is symptomatically.   > If greater than 5 seconds or not associated with her anxiety we will obtain EKG in the future

## 2012-06-20 IMAGING — CR DG CHEST 2V
2 series · 2 of 2 positions shown · non-contrast
Comparison: None.

CLINICAL DATA: Cough.  Congestion.

CHEST - 2 VIEW

[w chest pa]
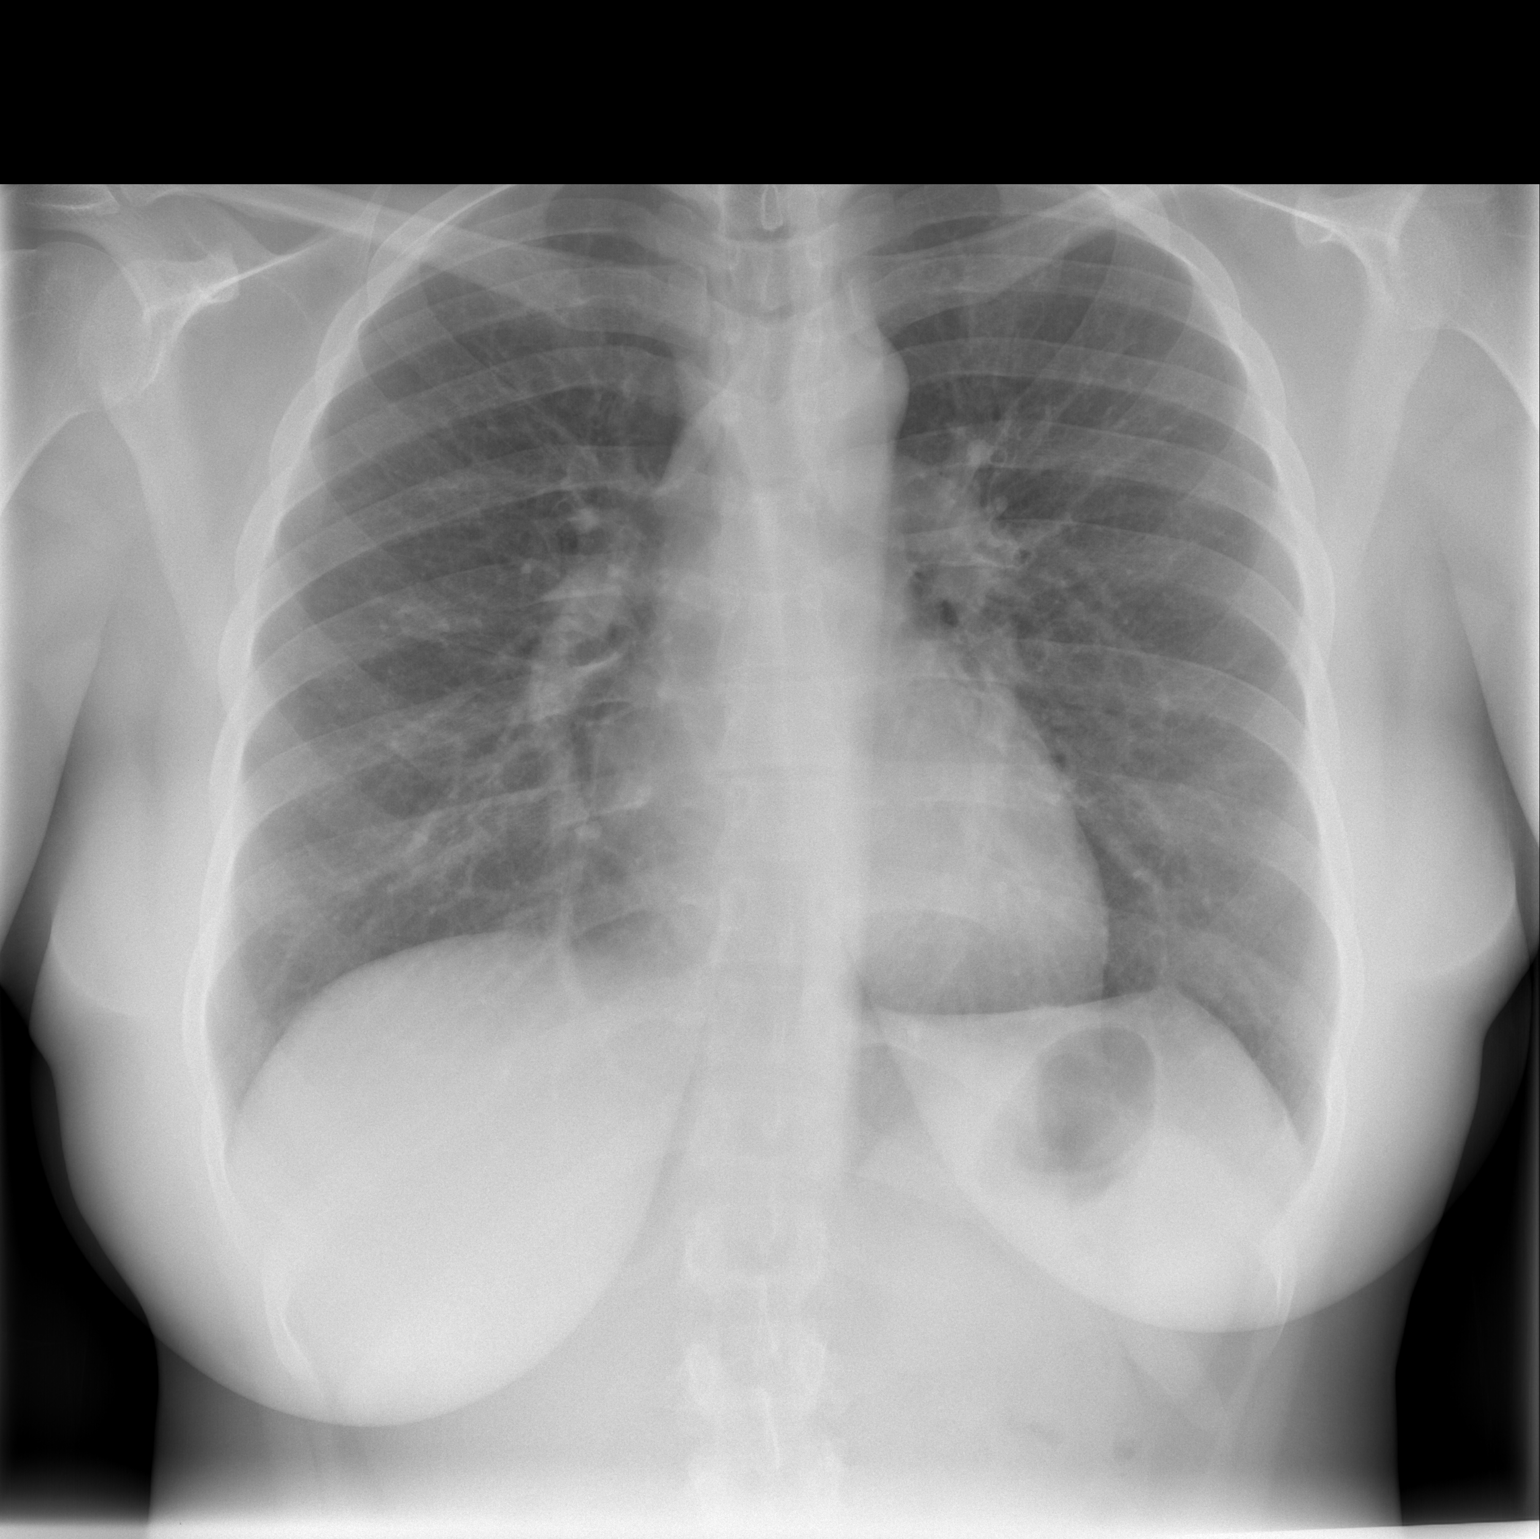

[w chest lat]
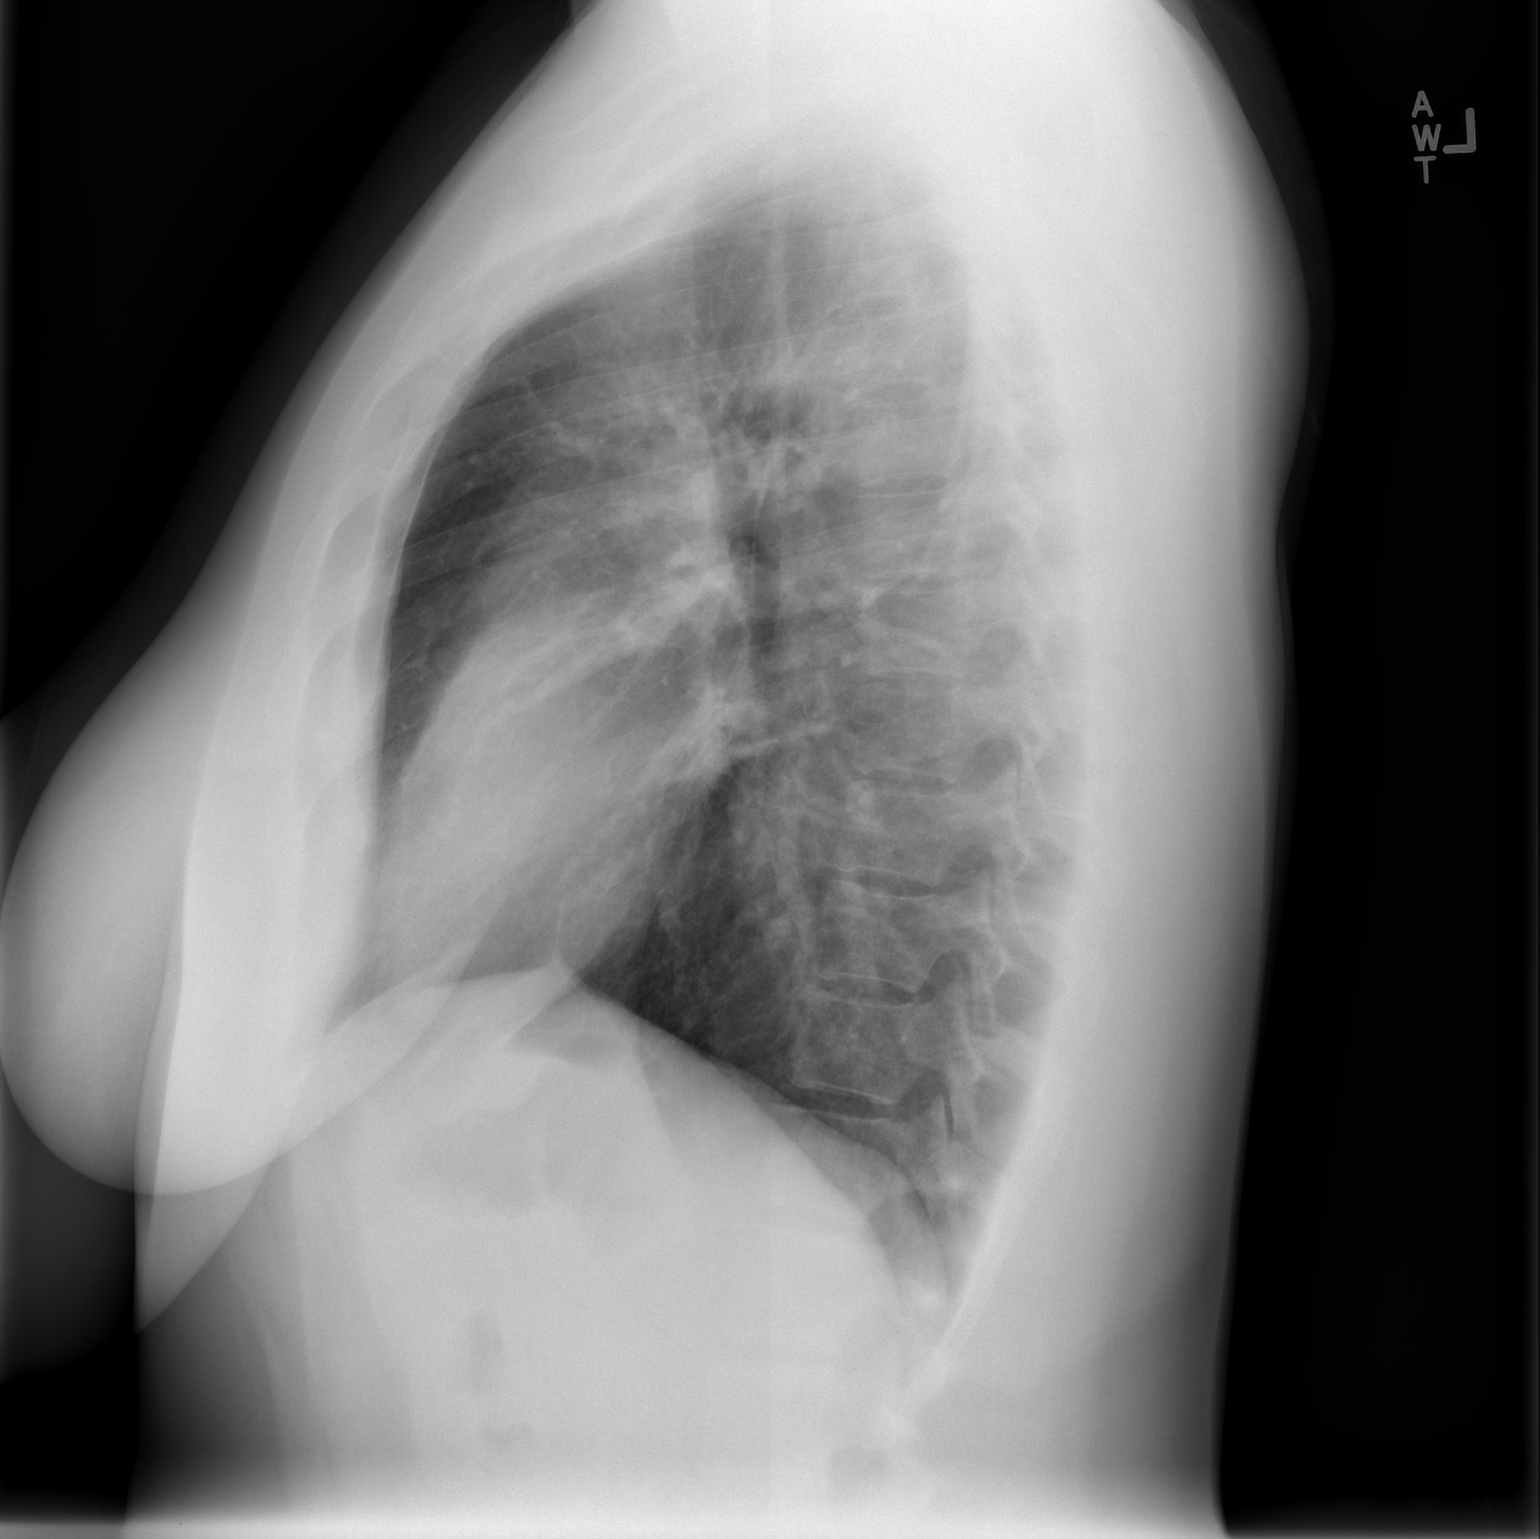

[2 of 2 positions shown; findings below may reference images not displayed]

FINDINGS: Lungs clear.  Cardiopericardial silhouette within normal
limits.  Trachea midline.  No airspace disease or effusion.
IMPRESSION: No active cardiopulmonary disease.

## 2012-06-21 ENCOUNTER — Telehealth: Payer: Self-pay | Admitting: Sports Medicine

## 2012-06-21 NOTE — Assessment & Plan Note (Signed)
Patient seems to be doing significantly better than prior to undergoing counseling.  She does report stress management techniques that seem to be effective for her.  Continue BuSpar

## 2012-06-21 NOTE — Telephone Encounter (Signed)
PT called and notified.  Gurjit Loconte, Darlyne Russian, CMA

## 2012-06-21 NOTE — Assessment & Plan Note (Signed)
Patient denies any orthostatic symptoms currently

## 2012-06-21 NOTE — Telephone Encounter (Signed)
The INTEGRA is over the counter and is least expensive at [facility] (~$15 -20) for a 30 day supply.  Usually this is the best tolerated.  If not then can take regular OTC Iron Gluconate

## 2012-06-21 NOTE — Telephone Encounter (Signed)
Was presecribed an iron supplement. Wants to know if there is an over the counter supplement she can get Please advise

## 2012-06-21 NOTE — Telephone Encounter (Signed)
Spoke with patient and it would be less expensive for her to get something OTC.  Please advise.  Elizabeth Hines, Darlyne Russian, CMA

## 2012-06-28 ENCOUNTER — Encounter: Payer: Self-pay | Admitting: Sports Medicine

## 2012-06-28 NOTE — Telephone Encounter (Signed)
Error

## 2012-07-01 ENCOUNTER — Ambulatory Visit (INDEPENDENT_AMBULATORY_CARE_PROVIDER_SITE_OTHER): Payer: No Typology Code available for payment source | Admitting: Family Medicine

## 2012-07-01 ENCOUNTER — Encounter: Payer: Self-pay | Admitting: Family Medicine

## 2012-07-01 VITALS — BP 106/56 | HR 63 | Temp 98.9°F | Ht 69.0 in | Wt 186.0 lb

## 2012-07-01 DIAGNOSIS — R09A2 Foreign body sensation, throat: Secondary | ICD-10-CM | POA: Insufficient documentation

## 2012-07-01 DIAGNOSIS — F449 Dissociative and conversion disorder, unspecified: Secondary | ICD-10-CM

## 2012-07-01 DIAGNOSIS — F458 Other somatoform disorders: Secondary | ICD-10-CM

## 2012-07-01 NOTE — Patient Instructions (Signed)
Globus Syndrome  Globus Syndrome is a feeling of a lump or a sensation of something caught in your throat. Eating food or drinking fluids does not seem to get rid of it. Yet it is not noticeable during the actual act of swallowing food or liquids. Usually there is nothing physically wrong. It is troublesome because it is an unpleasant sensation which is sometimes difficult to ignore and at times may seem to worsen. The syndrome is quite common. It is estimated 45% of the population experiences features of the condition at some stage during their lives. The symptoms are usually temporary. The largest group of people who feel the need to seek medical treatment is females between the ages of 30 to 60.   CAUSES   Globus Syndrome appears to be triggered by or aggravated by stress, anxiety and depression.  · Tension related to stress could product abnormal muscle spasms in the esophagus which would account for the sensation of a lump or ball in your throat.  · Frequent swallowing or drying of the throat caused by anxiety or other strong emotions can also produce this uncomfortable sensation in your throat.  · Fear and sadness can be expressed by the body in many ways. For instance, if you had a relative with throat cancer you might become overly concerned about your own health and develop uncomfortable sensations in your throat.  · The reaction to a crisis or a trauma event in your life can take the form of a lump in your throat. It is as if you are indirectly saying you can not handle or "swallow" one more thing.  DIAGNOSIS   Usually your caregiver will know what is wrong by talking to you and examining you.  If the condition persists for several days, more testing may be done to make sure there is not another problem present. This is usually not the case.  TREATMENT   · Reassurance is often the best treatment available. Usually the problem leaves without treatment over several days.  · Sometimes anti-anxiety medications  may be prescribed.  · Counseling or talk therapy can also help with strong underlying emotions.  · Note that in most cases this is not something that keeps coming back and you should not be concerned or worried.  Document Released: 04/01/2003 Document Revised: 04/03/2011 Document Reviewed: 08/29/2007  ExitCare® Patient Information ©2014 [employer].

## 2012-07-01 NOTE — Progress Notes (Signed)
Subjective:     Patient ID: Elizabeth Hines, female   DOB: 1985/06/21, 27 y.o.   MRN: [id number]  HPI Throat irritation: Patient presents with about a 1 week history with a "constricting" feeling in her throat. She reports she has no breathing or eating difficulty. She states it is bothering her and "just feels like something is stuck in my throat." She started Flonase 2 weeks ago and is wondering if it is causing this sensation or swelling. She does not endorse pain, fever or changes in appetite or activity. She endorses nasal drainage and mild cough.  Review of Systems See above HPI    Objective:   Physical Exam BP 106/56  Pulse 63  Temp(Src) 98.9 F (37.2 C) (Oral)  Ht 5\' 9"  (1.753 m)  Wt 186 lb (84.369 kg)  BMI 27.45 kg/m2\   Gen: pleasant female. NAD.  HENT: AT.[place]. Bilateral eyes WNL. Bilateral Nares with irritation and erythema. Bilateral ears with fluid bubbles, otherwise normal. Throat:No erythema or exudates. No swelling of tonsils. WNL. Neck: Supple. No thyroid megaly or tracheal deviation. No masses. CV: RRR. No murmur Chest: CTAB. No wheeze or crackles. Normal WOB

## 2012-07-01 NOTE — Assessment & Plan Note (Addendum)
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 After education and discussion the patient agreed she thought it might be connected to her anxiety. - Education and AVS on Globus sensation covered in office today. Reassurance given.  - Encouraged daily antihistamine as well as continue Flonase.  - F/U: PRN

## 2012-08-01 ENCOUNTER — Ambulatory Visit (INDEPENDENT_AMBULATORY_CARE_PROVIDER_SITE_OTHER): Payer: No Typology Code available for payment source | Admitting: Sports Medicine

## 2012-08-01 ENCOUNTER — Telehealth: Payer: Self-pay | Admitting: Sports Medicine

## 2012-08-01 ENCOUNTER — Other Ambulatory Visit (HOSPITAL_COMMUNITY)
Admission: RE | Admit: 2012-08-01 | Discharge: 2012-08-01 | Disposition: A | Discharge status: Home / Self Care | Payer: No Typology Code available for payment source | Source: Ambulatory Visit | Attending: Family Medicine | Admitting: Family Medicine

## 2012-08-01 ENCOUNTER — Encounter: Payer: Self-pay | Admitting: Sports Medicine

## 2012-08-01 VITALS — BP 92/49 | HR 52 | Temp 99.2°F | Ht 69.0 in | Wt 183.0 lb

## 2012-08-01 DIAGNOSIS — Z309 Encounter for contraceptive management, unspecified: Secondary | ICD-10-CM

## 2012-08-01 DIAGNOSIS — Z113 Encounter for screening for infections with a predominantly sexual mode of transmission: Secondary | ICD-10-CM | POA: Insufficient documentation

## 2012-08-01 DIAGNOSIS — Z202 Contact with and (suspected) exposure to infections with a predominantly sexual mode of transmission: Secondary | ICD-10-CM

## 2012-08-01 DIAGNOSIS — F411 Generalized anxiety disorder: Secondary | ICD-10-CM

## 2012-08-01 DIAGNOSIS — Z0000 Encounter for general adult medical examination without abnormal findings: Secondary | ICD-10-CM

## 2012-08-01 DIAGNOSIS — F419 Anxiety disorder, unspecified: Secondary | ICD-10-CM

## 2012-08-01 DIAGNOSIS — Z2089 Contact with and (suspected) exposure to other communicable diseases: Secondary | ICD-10-CM

## 2012-08-01 DIAGNOSIS — D649 Anemia, unspecified: Secondary | ICD-10-CM

## 2012-08-01 MED ORDER — BUSPIRONE HCL 15 MG PO TABS: 15.0000 mg | ORAL_TABLET | Freq: Every day | ORAL | Status: DC

## 2012-08-01 MED ORDER — FERROUS GLUCONATE 225 (27 FE) MG PO TABS: 240.0000 mg | ORAL_TABLET | Freq: Three times a day (TID) | ORAL | Status: DC

## 2012-08-01 MED ORDER — BUSPIRONE HCL 10 MG PO TABS: 10.0000 mg | ORAL_TABLET | Freq: Every day | ORAL | Status: DC

## 2012-08-01 NOTE — Telephone Encounter (Signed)
She can take one tablet first thing in the morning and one tablet at night before bed.  It is okay to take these medications with her BuSpar. Please call and inform patient

## 2012-08-01 NOTE — Patient Instructions (Addendum)
It was nice to see you today.   Today we discussed: 1. Chronic anxiety Keep taking - busPIRone (BUSPAR) 10 MG tablet; Take 1 tablet (10 mg total) by mouth daily. At 1100AM  Dispense: 30 tablet; Refill: 5 - busPIRone (BUSPAR) 15 MG tablet; Take 1 tablet (15 mg total) by mouth daily. At 1100pm  Dispense: 30 tablet; Refill: 5  2. Contraception management Please come back to see me a few like to discuss this further.  I highly encourage you to be on a form of birth control  3. Healthcare maintenance You're due for a Pap smear in October  Anemia Keep taking the iron that you purchased.  6. Contact with and suspected exposure to infections with predominantly sexual mode of transmission I am checking you for sexually transmitted infections as well as pregnancy - Urine cytology ancillary only - POCT urine pregnancy   Please plan to return to see me in the end of September or the beginning of October as we discussed.  If you need anything prior to seeing me please call the clinic.  Please Bring all medications with you to each appointment.

## 2012-08-01 NOTE — Telephone Encounter (Signed)
How many hours apart between doses of iron.

## 2012-08-01 NOTE — Progress Notes (Signed)
  [facility]  Patient name: Elizabeth Hines MRN [id number]  Date of birth: 10/24/1985  CC & HPI:  Elizabeth Hines is a 27 y.o. female presenting today for follow up of:  # Chronic Anxiety: stable on buspar, mild attacks only. No side effects  ------------------------------------------------------------------------------------------------------------------ Medication Compliance: compliant most of the time  Diet Compliance: compliant most of the time ------------------------------------------------------------------------------------------------------------------ New Concerns:  # Sexually active: Using condoms, no other form of birth control, denies any discharge, dysuria but would like to be tested for sexually transmitted infections.    ROS:  PER HPI  Pertinent History Reviewed:  Medical & Surgical Hx:  Reviewed: Significant for Anxiety Medications: Reviewed & Updated - See associated section in EMR Social History: Reviewed -  reports that she has never smoked. She does not have any smokeless tobacco history on file.   Objective Findings:  Vitals: BP 92/49  Pulse 52  Temp(Src) 99.2 F (37.3 C) (Oral)  Ht 5\' 9"  (1.753 m)  Wt 183 lb (83.008 kg)  BMI 27.01 kg/m2  PE: GENERAL:  Adult AA  female. In no discomfort; no respiratory distress. PSYCH: Alert and appropriately interactive; Insight:Good.  Reports appropriate coping strategies. H&N: AT/[place], trachea midline EENT:  MMM, no scleral icterus, EOMi, nasal pallor, nasal congestion.  No maxillary/frontal tenderness HEART: RRR, S1/S2 heard, no murmur LUNGS: CTA B, no wheezes, no crackles EXTREMITIES: Moves all 4 extremities spontaneously, warm well perfused, no edema, bilateral DP and PT pulses 2/4.     Assessment & Plan:

## 2012-08-02 ENCOUNTER — Encounter: Payer: Self-pay | Admitting: Sports Medicine

## 2012-08-02 NOTE — Telephone Encounter (Signed)
Spoke with patient and informed her of below 

## 2012-08-07 NOTE — Assessment & Plan Note (Signed)
Continue over-the-counter iron supplement.

## 2012-08-07 NOTE — Assessment & Plan Note (Addendum)
No changes in medication regimen, encouraged compliance Continue Journaling, encouraged to return to counseling

## 2012-08-07 NOTE — Assessment & Plan Note (Signed)
Extensive discussion regarding contraception management. Patient not interested at this time.  We'll continue using condoms only.  Knows there is a high failure rate.

## 2012-08-07 NOTE — Assessment & Plan Note (Addendum)
>  Due for Pap smear at next visit

## 2012-08-07 NOTE — Assessment & Plan Note (Signed)
Asymptomatic, Check urine GC chlamydia and urine pregnancy

## 2012-08-22 ENCOUNTER — Encounter: Payer: Self-pay | Admitting: Family Medicine

## 2012-08-22 ENCOUNTER — Ambulatory Visit (INDEPENDENT_AMBULATORY_CARE_PROVIDER_SITE_OTHER): Payer: No Typology Code available for payment source | Admitting: Family Medicine

## 2012-08-22 VITALS — BP 121/78 | HR 75 | Temp 98.2°F | Ht 69.0 in | Wt 183.0 lb

## 2012-08-22 DIAGNOSIS — F419 Anxiety disorder, unspecified: Secondary | ICD-10-CM

## 2012-08-22 DIAGNOSIS — F411 Generalized anxiety disorder: Secondary | ICD-10-CM

## 2012-08-22 NOTE — Patient Instructions (Addendum)
It was good to see today. I am sorry you aren't feeling well. Restart your 15mg  Buspar. Please make an appointment with Selena Batten sometime soon. Follow up with Dr. Berline Chough within the next few weeks.  Call us if you have any concerns or if you fail to improve.  Shagun Wordell M. Arin Peral, M.D.

## 2012-08-22 NOTE — Progress Notes (Signed)
Patient ID: Elizabeth Hines, female   DOB: January 08, 1986, 27 y.o.   MRN: [id number]  [facility] Conrado Nance M. Saran Laviolette, MD Phone: [phone]   Subjective: HPI: Patient is a 27 y.o. female presenting to clinic today for same day appointment for anxiety.  1. Anxiety- chronic issue. Has been seen by Marchelle Folks at [facility], as well as Selena Batten at [facility]. On Buspar, previously on 15mg  and ran out so she has been taking 10mg  which has not been working well. (States her Rx was changed to 10mg  which is not working for her at all.) She states she cannot sit still and she has internal "jumpiness". She has had these symptoms before which resolved with Buspar. Now has 15mg  tabs that she will start tonight. Denies drug use. Work and finances might have triggered her.   No headaches, no chest pain, no upset stomach. Some palpitations.   History Reviewed: Non smoker. No drug use. No alcohol use   ROS: Please see HPI above.  Objective: Office vital signs reviewed. BP 121/78  Pulse 75  Temp(Src) 98.2 F (36.8 C) (Oral)  Ht 5\' 9"  (1.753 m)  Wt 183 lb (83.008 kg)  BMI 27.01 kg/m2  LMP 08/13/2012  Physical Examination:  General: Awake, alert. NAD. Appears uncomfortable HEENT: Atraumatic, normocephalic. MMM. Pupils round and reactive Neck: No masses palpated. No LAD Pulm: CTAB, no wheezes Cardio: RRR, no murmurs appreciated. No irregular heartrate Abdomen:+BS, soft, nontender, nondistended Extremities: No edema Neuro: Grossly intact.  Psych: Makes appropriate eye contact. Answers questions without hesitation. Does appear jittery  Assessment: 27 y.o. female with anxiety  Plan: See Problem List and After Visit Summary

## 2012-08-22 NOTE — Assessment & Plan Note (Signed)
Patient does better on Buspar 15mg  and she has been encouraged to take this dose rather than the 10mg . She states at this time she does not need anything else. No red flags- No SI/HI, drug use or increased stressors. I have encouraged her to talk with her counselor soon and follow up with PCP.

## 2012-08-27 ENCOUNTER — Ambulatory Visit (INDEPENDENT_AMBULATORY_CARE_PROVIDER_SITE_OTHER): Payer: No Typology Code available for payment source | Admitting: Family Medicine

## 2012-08-27 ENCOUNTER — Encounter: Payer: Self-pay | Admitting: Family Medicine

## 2012-08-27 VITALS — BP 120/83 | HR 58 | Temp 97.8°F | Wt 182.0 lb

## 2012-08-27 DIAGNOSIS — F419 Anxiety disorder, unspecified: Secondary | ICD-10-CM

## 2012-08-27 DIAGNOSIS — F411 Generalized anxiety disorder: Secondary | ICD-10-CM

## 2012-08-27 MED ORDER — FLUOXETINE HCL 20 MG PO TABS: 20.0000 mg | ORAL_TABLET | Freq: Every day | ORAL | Status: DC

## 2012-08-27 NOTE — Progress Notes (Signed)
  Subjective:    Patient ID: Elizabeth Hines, female    DOB: 1986-01-18, 27 y.o.   MRN: [id number]  HPI  27 year old F who presents for same day appointment for "feeling jittery." Has a history of anxiety. She was seen on 08/22/12 for this issue and was restarted Buspar 25 mg daily (after being out of meds), but does not feel like this is controlling her symptoms. She thinks her level of worrying about things is a 4/10.  She has appointment with Selena Batten at [facility] to discuss anxiety.   Anxiety started as a child and return in 2012. Hx of taking ativan in 2007.   Patient Active Problem List   Diagnosis Date Noted  . Contact with and suspected exposure to infections with predominantly sexual mode of transmission 08/01/2012  . 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 Allergic rhinitis 06/11/2012  . Anemia 06/11/2012  . Dizzy 05/15/2012  . Paresthesias of bilateral upper limbs, worse on right 04/09/2012  . Pain in finger - B UEs 12/28/2011  . Palpitations 12/19/2011  . Healthcare maintenance 09/22/2011  . Contraception management 07/29/2011  . Plantar wart 07/29/2011  . Overweight (BMI 25.0-29.9) 03/28/2011  . 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 Hyperthyroidism 10/18/2010  . Chronic anxiety 10/14/2010     Past Medical History  Diagnosis Date  . Vaginal discharge 10/31/2010  . Trichomonas     10/14/07; 11/25/07; 06/01/08  . Allergic rhinitis 06/11/2012  . Anemia 06/11/2012     Review of Systems     Objective:   Physical Exam BP 120/83  Pulse 58  Temp(Src) 97.8 F (36.6 C) (Oral)  Wt 182 lb (82.555 kg)  BMI 26.86 kg/m2  LMP 08/13/2012  Gen: young AAF, non ill appearing, fidgeting throughout the exam Neuro: constantly moving but no focal deficits, no tremors, 5/5 strength in upper and lower extremities, movements not consistent dyskinesia  Psych: clear thought content, normal speech, notes "trying not think so much"  GAD-7 = 12     Assessment & Plan:

## 2012-08-27 NOTE — Patient Instructions (Addendum)
I think your jittery feeling is related to anxiety. Please start taking a medication call fluoxetine once daily. You will need to take it for at least 4 weeks to determine the effect. We can also increase the dose. If you have any strange side effects, stop the medication and let me know.   I think this will help out a lot.   Take Care,   Dr. Clinton Sawyer

## 2012-08-27 NOTE — Assessment & Plan Note (Signed)
Persistent symptoms so will start fluoxetine 20 mg daily. F/u in 4 weeks.

## 2012-09-09 ENCOUNTER — Other Ambulatory Visit (HOSPITAL_COMMUNITY)
Admission: RE | Admit: 2012-09-09 | Discharge: 2012-09-09 | Disposition: A | Discharge status: Home / Self Care | Payer: No Typology Code available for payment source | Source: Ambulatory Visit | Attending: Family Medicine | Admitting: Family Medicine

## 2012-09-09 ENCOUNTER — Encounter: Payer: Self-pay | Admitting: Family Medicine

## 2012-09-09 ENCOUNTER — Ambulatory Visit (INDEPENDENT_AMBULATORY_CARE_PROVIDER_SITE_OTHER): Payer: No Typology Code available for payment source | Admitting: Family Medicine

## 2012-09-09 VITALS — BP 128/82 | HR 74 | Temp 98.2°F | Wt 182.0 lb

## 2012-09-09 DIAGNOSIS — B9689 Other specified bacterial agents as the cause of diseases classified elsewhere: Secondary | ICD-10-CM

## 2012-09-09 DIAGNOSIS — Z113 Encounter for screening for infections with a predominantly sexual mode of transmission: Secondary | ICD-10-CM | POA: Insufficient documentation

## 2012-09-09 DIAGNOSIS — A499 Bacterial infection, unspecified: Secondary | ICD-10-CM

## 2012-09-09 DIAGNOSIS — N898 Other specified noninflammatory disorders of vagina: Secondary | ICD-10-CM

## 2012-09-09 DIAGNOSIS — N760 Acute vaginitis: Secondary | ICD-10-CM

## 2012-09-09 LAB — POCT WET PREP (WET MOUNT): WBC, Wet Prep HPF POC: <5

## 2012-09-09 MED ORDER — FLUCONAZOLE 150 MG PO TABS: 150.0000 mg | ORAL_TABLET | Freq: Once | ORAL | Status: DC

## 2012-09-09 MED ORDER — METRONIDAZOLE 500 MG PO TABS: 500.0000 mg | ORAL_TABLET | Freq: Two times a day (BID) | ORAL | Status: DC

## 2012-09-09 NOTE — Progress Notes (Signed)
Patient ID: Elizabeth Hines, female   DOB: 02/24/1985, 27 y.o.   MRN: [id number] Subjective:     Elizabeth Hines is a 27 y.o. female who presents for evaluation of an abnormal vaginal discharge. Symptoms have been present for 3 days. Vaginal symptoms: local irritation, odor and vulvar itching. Contraception: condoms. She denies abnormal bleeding, blisters, bumps and warts Sexually transmitted infection risk: very low risk of STD exposure.  LMP: 08/13/2012   Review of Systems Pertinent items are noted in HPI.    Objective:    BP 128/82  Pulse 74  Temp(Src) 98.2 F (36.8 C) (Oral)  Wt 182 lb (82.555 kg)  BMI 26.86 kg/m2  LMP 08/13/2012 General appearance: alert, cooperative and no distress Abdomen: soft, non-tender; bowel sounds normal; no masses,  no organomegaly Pelvic:  External genitalia: normal. No lesions.  Vagina: normal with moderate white mucoid discharge. Mild odor.  Cervix: normal. No lesions.  Bimanual: no CMT. Uterus normal. No adnexal mass or tenderness.   Wet prep and GC/chlam specimens collected  Assessment and Plan:

## 2012-09-09 NOTE — Patient Instructions (Addendum)
Elizabeth Hines,  Thank you for coming in today. Your wet prep is positive for BV: Take flagyl for one week. Followed by diflucan to prevent yeast infection.  Dr. Armen Pickup

## 2012-09-09 NOTE — Assessment & Plan Note (Signed)
A: Your wet prep is positive for BV.  P:  Take flagyl for one week. Followed by diflucan to prevent yeast infection.

## 2012-09-10 ENCOUNTER — Encounter: Payer: Self-pay | Admitting: *Deleted

## 2012-09-10 ENCOUNTER — Telehealth: Payer: Self-pay | Admitting: *Deleted

## 2012-09-10 NOTE — Telephone Encounter (Signed)
LMOVM "remaining labs are negative, if you have additional questions or would like more detail please give [place] a callback.". Elizabeth Hines, [name]

## 2012-09-10 NOTE — Telephone Encounter (Signed)
Message copied by Osborne Oman on Tue Sep 10, 2012 12:33 PM ------      Message from: Dessa Phi      Created: Tue Sep 10, 2012 11:32 AM       Gonorrhea and chlamydia negative.      Please inform patient. ------

## 2012-11-19 ENCOUNTER — Encounter: Payer: Self-pay | Admitting: Sports Medicine

## 2012-11-19 ENCOUNTER — Ambulatory Visit (INDEPENDENT_AMBULATORY_CARE_PROVIDER_SITE_OTHER): Payer: No Typology Code available for payment source | Admitting: Sports Medicine

## 2012-11-19 VITALS — BP 129/68 | HR 77 | Temp 98.9°F | Ht 69.0 in | Wt 185.0 lb

## 2012-11-19 DIAGNOSIS — N870 Mild cervical dysplasia: Secondary | ICD-10-CM

## 2012-11-19 DIAGNOSIS — J309 Allergic rhinitis, unspecified: Secondary | ICD-10-CM

## 2012-11-19 DIAGNOSIS — F411 Generalized anxiety disorder: Secondary | ICD-10-CM

## 2012-11-19 DIAGNOSIS — Z23 Encounter for immunization: Secondary | ICD-10-CM

## 2012-11-19 DIAGNOSIS — Z0000 Encounter for general adult medical examination without abnormal findings: Secondary | ICD-10-CM

## 2012-11-19 DIAGNOSIS — F419 Anxiety disorder, unspecified: Secondary | ICD-10-CM

## 2012-11-19 MED ORDER — BUSPIRONE HCL 10 MG PO TABS: 10.0000 mg | ORAL_TABLET | Freq: Every day | ORAL | Status: DC

## 2012-11-19 MED ORDER — BUSPIRONE HCL 15 MG PO TABS: 15.0000 mg | ORAL_TABLET | Freq: Every day | ORAL | Status: DC

## 2012-11-19 MED ORDER — FLUTICASONE PROPIONATE 50 MCG/ACT NA SUSP: 2.0000 | Freq: Every day | NASAL | Status: DC

## 2012-11-19 NOTE — Assessment & Plan Note (Signed)
Flu shot today 

## 2012-11-19 NOTE — Patient Instructions (Signed)
   Refill BuSpar and Flonase  Continue with counseling  Pap smear at next visit  Flu shot today   If you need anything prior to your next visit please call the clinic. Please Bring all medications or accurate medication list with you to each appointment; an accurate medication list is essential in providing you the best care possible.

## 2012-11-19 NOTE — Assessment & Plan Note (Signed)
Patient needs repeat Pap smear at next visit, she is on her menses today

## 2012-11-19 NOTE — Assessment & Plan Note (Signed)
Refill Flonase. 

## 2012-11-19 NOTE — Progress Notes (Signed)
[facility] Elizabeth Hines - 27 y.o. female MRN [id number]  Date of birth: 1985/03/28  CC, HPI, INTERVAL HISTORY & ROS  Elizabeth Hines is here today for followup of her chronic anxiety.     She reports she has had intermittent flareups of her chronic anxiety and has been seen in clinic by the providers twice due to this.  She was recommended to start Prozac but she declined this.  She is taking her BuSpar 10 mg every morning and 15 mg each bedtime and declines any side effects at this time.  Denies palpitations.  Has recently moved and has not been involved in counseling.  Has not had called 911 due to panic attack in quite some time  She is due for a repeat Pap smear requests this be done at a later time.  She reports she has had worsening right-sided nasal congestion and has not been using any antihistamines or nasal steroids.  History  Past Medical, Surgical, Social, and Family History Reviewed per EMR Medications and Allergies reviewed and all updated if necessary. Objective Findings  VITALS: HR: 77 bpm  BP: 129/68 mmHg  TEMP: 98.9 F (37.2 C) (Oral)  RESP:    HT: 5\' 9"  (175.3 cm)  WT: 185 lb (83.915 kg)  BMI: 27.4   BP Readings from Last 3 Encounters:  11/19/12 129/68  09/09/12 128/82  08/27/12 120/83   Wt Readings from Last 3 Encounters:  11/19/12 185 lb (83.915 kg)  09/09/12 182 lb (82.555 kg)  08/27/12 182 lb (82.555 kg)     PHYSICAL EXAM: GENERAL:  adult [place] female. In no discomfort; no respiratory distress  PSYCH: alert and appropriate, good insight   HNEENT: H&N: AT/[place], trachea midline  Eyes: no scleral icterus, no conjunctival exudate  Ears:  tympanic membranes pearly gray, serous effusion without erythema or air-fluid level   Nose:  large left-sided nasal polyp, generalized mucosal pallor   Oropharynx: MMM  Dentention:     CARDIO: RRR, S1/S2 heard, no murmur  LUNGS: CTA B, no wheezes, no crackles  ABDOMEN:   EXTREM:  Warm, well  perfused.  Moves all 4 extremities spontaneously; no lateralization.    No pretibial edema.  GU:   SKIN:     Assessment & Plan   Problems addressed today: General Plan & Pt Instructions:  1. Chronic anxiety   2. CIN I (cervical intraepithelial neoplasia I)   3. Allergic rhinitis   4. Healthcare maintenance   5. Need for prophylactic vaccination and inoculation against influenza       Refill BuSpar and Flonase  Continue with counseling  Pap smear at next visit  Flu shot today     For further discussion of A/P and for follow up issues see problem based charting if applicable.

## 2012-11-19 NOTE — Assessment & Plan Note (Signed)
Patient continues to be resistant to starting SSRI. Patient reports now she's back on her BuSpar she is improved.  We'll continue with 10 mg q. a.m. and 15 mg each bedtime. Refills provided today. Continue with counseling

## 2012-12-02 ENCOUNTER — Other Ambulatory Visit (HOSPITAL_COMMUNITY)
Admission: RE | Admit: 2012-12-02 | Discharge: 2012-12-02 | Disposition: A | Discharge status: Home / Self Care | Payer: Medicaid Other | Source: Ambulatory Visit | Attending: Family Medicine | Admitting: Family Medicine

## 2012-12-02 ENCOUNTER — Other Ambulatory Visit (HOSPITAL_COMMUNITY)
Admission: RE | Admit: 2012-12-02 | Discharge: 2012-12-02 | Disposition: A | Discharge status: Home / Self Care | Payer: Medicaid Other | Source: Ambulatory Visit | Attending: Sports Medicine | Admitting: Sports Medicine

## 2012-12-02 ENCOUNTER — Encounter: Payer: Self-pay | Admitting: Sports Medicine

## 2012-12-02 ENCOUNTER — Ambulatory Visit (INDEPENDENT_AMBULATORY_CARE_PROVIDER_SITE_OTHER): Payer: No Typology Code available for payment source | Admitting: Sports Medicine

## 2012-12-02 VITALS — BP 100/68 | HR 78 | Temp 98.8°F | Ht 69.0 in | Wt 184.8 lb

## 2012-12-02 DIAGNOSIS — N760 Acute vaginitis: Secondary | ICD-10-CM

## 2012-12-02 DIAGNOSIS — Z124 Encounter for screening for malignant neoplasm of cervix: Secondary | ICD-10-CM

## 2012-12-02 DIAGNOSIS — B373 Candidiasis of vulva and vagina: Secondary | ICD-10-CM

## 2012-12-02 DIAGNOSIS — N870 Mild cervical dysplasia: Secondary | ICD-10-CM

## 2012-12-02 DIAGNOSIS — Z113 Encounter for screening for infections with a predominantly sexual mode of transmission: Secondary | ICD-10-CM | POA: Insufficient documentation

## 2012-12-02 DIAGNOSIS — Z01419 Encounter for gynecological examination (general) (routine) without abnormal findings: Secondary | ICD-10-CM | POA: Insufficient documentation

## 2012-12-02 LAB — POCT WET PREP (WET MOUNT): Clue Cells Wet Prep Whiff POC: NEGATIVE

## 2012-12-02 MED ORDER — FLUCONAZOLE 150 MG PO TABS: 150.0000 mg | ORAL_TABLET | Freq: Once | ORAL | Status: DC

## 2012-12-02 NOTE — Progress Notes (Signed)
[facility] Elizabeth Hines - 27 y.o. female MRN [id number]  Date of birth: 1985/08/18  CC, HPI, INTERVAL HISTORY & ROS  Elizabeth Hines is here today for Pap smear.      She reports overall doing well.  She does report having vaginal discharge and itching x2 weeks.  She has no dysuria, frequency, fever, chills.  No back pain  History  Past Medical, Surgical, Social, and Family History Reviewed per EMR Medications and Allergies reviewed and all updated if necessary. Objective Findings  VITALS: HR: 78 bpm  BP: 100/68 mmHg  TEMP: 98.8 F (37.1 C) (Oral)  RESP:    HT: 5\' 9"  (175.3 cm)  WT: 184 lb 12.8 oz (83.825 kg)  BMI: 27.3   BP Readings from Last 3 Encounters:  12/02/12 100/68  11/19/12 129/68  09/09/12 128/82   Wt Readings from Last 3 Encounters:  12/02/12 184 lb 12.8 oz (83.825 kg)  11/19/12 185 lb (83.915 kg)  09/09/12 182 lb (82.555 kg)     PHYSICAL EXAM: GENERAL:  adult [place] female. In no discomfort; no respiratory distress  PSYCH: alert and appropriate, good insight   HNEENT:   CARDIO:   LUNGS:   ABDOMEN:   EXTREM:    GU: Chaperone: CMA  External: no skin lesions, normal appearing genitalia, thick creamy discharge  Vagina: no vaginal lesion, vaginal mucosa moist   Cervix: no cervical lesions, no CMT  Uterus: normal size, shape, consistency  non-tender  Adenexa: no masses, non-tender  TESTS:   cervix well-visualized, Pap smear today using cytology brush    SKIN:     Assessment & Plan   Problems addressed today: General Plan & Pt Instructions:  1. Screening for malignant neoplasm of the cervix   2. Vaginitis and vulvovaginitis, unspecified       I will send you a letter with your results from your Pap smear.      For further discussion of A/P and for follow up issues see problem based charting if applicable.

## 2012-12-02 NOTE — Assessment & Plan Note (Signed)
Protesting Pap smear obtained today. Will send letter with results

## 2012-12-02 NOTE — Assessment & Plan Note (Signed)
Diflucan treatment

## 2012-12-04 ENCOUNTER — Encounter: Payer: Self-pay | Admitting: Sports Medicine

## 2012-12-04 NOTE — Progress Notes (Signed)
PAP smear satisfactory results with endocervical transition zone absent; NEGATIVE FOR INTRAEPITHELIAL LESIONS OR MALIGNANCY.  Will need repeat Pap smear in one year; given history and absent endocervical transition zone.

## 2012-12-30 ENCOUNTER — Ambulatory Visit (INDEPENDENT_AMBULATORY_CARE_PROVIDER_SITE_OTHER): Payer: Medicaid Other | Admitting: Sports Medicine

## 2012-12-30 ENCOUNTER — Encounter: Payer: Self-pay | Admitting: Sports Medicine

## 2012-12-30 ENCOUNTER — Other Ambulatory Visit (HOSPITAL_COMMUNITY)
Admission: RE | Admit: 2012-12-30 | Discharge: 2012-12-30 | Disposition: A | Discharge status: Home / Self Care | Payer: Medicaid Other | Source: Ambulatory Visit | Attending: Sports Medicine | Admitting: Sports Medicine

## 2012-12-30 ENCOUNTER — Other Ambulatory Visit (HOSPITAL_COMMUNITY)
Admission: RE | Admit: 2012-12-30 | Discharge: 2012-12-30 | Disposition: A | Discharge status: Home / Self Care | Payer: Medicaid Other | Source: Ambulatory Visit | Attending: Family Medicine | Admitting: Family Medicine

## 2012-12-30 VITALS — BP 122/66 | HR 89 | Temp 99.0°F | Ht 69.0 in | Wt 188.9 lb

## 2012-12-30 DIAGNOSIS — Z113 Encounter for screening for infections with a predominantly sexual mode of transmission: Secondary | ICD-10-CM | POA: Insufficient documentation

## 2012-12-30 DIAGNOSIS — N760 Acute vaginitis: Secondary | ICD-10-CM

## 2012-12-30 DIAGNOSIS — Z01419 Encounter for gynecological examination (general) (routine) without abnormal findings: Secondary | ICD-10-CM | POA: Insufficient documentation

## 2012-12-30 DIAGNOSIS — Z124 Encounter for screening for malignant neoplasm of cervix: Secondary | ICD-10-CM

## 2012-12-30 LAB — POCT WET PREP (WET MOUNT)

## 2012-12-30 MED ORDER — METRONIDAZOLE 0.75 % VA GEL: 1.0000 | Freq: Two times a day (BID) | VAGINAL | Status: DC

## 2012-12-30 MED ORDER — CLINDAMYCIN HCL 300 MG PO CAPS: 300.0000 mg | ORAL_CAPSULE | Freq: Two times a day (BID) | ORAL | Status: DC

## 2012-12-30 NOTE — Addendum Note (Signed)
Addended by: Gaspar Bidding D on: 12/30/2012 05:45 PM   Modules accepted: Orders

## 2012-12-30 NOTE — Patient Instructions (Signed)
   You will hear from [place] about your results.  If this is bacterial vaginosis we will call in the cream for you.  Followup as we previously discussed   If you need anything prior to your next visit please call the clinic. Please Bring all medications or accurate medication list with you to each appointment; an accurate medication list is essential in providing you the best care possible.

## 2012-12-30 NOTE — Progress Notes (Addendum)
Wet prep positive for bacterial vaginosis. Has had recurrent and would prefer not to use Flagyl by mouth; will prescribe vaginal cream but may be prohibitively expensive.  Patient does not wish for this Will also send in clindamycin as alternative treatment with instructions to not fill it if she picks up the Flagyl.  Called and discussed with the patient who is aware of plan to pick up only one prescription pending the cost of the MetroGel.

## 2012-12-30 NOTE — Progress Notes (Signed)
  Twylla Arceneaux - 27 y.o. female MRN [id number]  Date of birth: 09-03-1985  CC, HPI, INTERVAL HISTORY & ROS  Maralyn is here today for acute visit for vaginal discharge.    Symptoms (If Blank has not been addressed)  Major Sxs:  discharge, odor, itching  Character  cottage cheese like discharge  Onset  1 week ago  Discharge  Yes   Odor  Yes   Rash  No  Urinary Symptoms  dysuria at onset of stream  Fevers, Chills, Rigors  No  LMP  Patient's last menstrual period was 12/16/2012.   Sexually Activity  Yes - uses condoms; now abstinent  Pregnant  No  ?STI Exposure  No  Prior infections?  No  Recent ABX?  Yes - diflucan last month and 3 days ago  Therapy Tried       History  Past Medical, Surgical, Social, and Family History Reviewed per EMR Medications and Allergies reviewed and all updated if necessary. Objective Findings  VITALS: HR: 89 bpm  BP: 122/66 mmHg  TEMP: 99 F (37.2 C) (Oral)  RESP:    HT: 5\' 9"  (175.3 cm)  WT: 188 lb 14.4 oz (85.684 kg)  BMI: 28   BP Readings from Last 3 Encounters:  12/30/12 122/66  12/02/12 100/68  11/19/12 129/68   Wt Readings from Last 3 Encounters:  12/30/12 188 lb 14.4 oz (85.684 kg)  12/02/12 184 lb 12.8 oz (83.825 kg)  11/19/12 185 lb (83.915 kg)     PHYSICAL EXAM: GENERAL: Adult AA  female. In no discomfort; no respiratory distress  PSYCH: alert and appropriate, good insight   HNEENT:   CARDIO:   LUNGS:   ABDOMEN:   EXTREM:    GU: Chaperone: CMA - sara  External: no skin lesions, normal appearing genitalia, mild discharge  Vagina: no vaginal lesion, vaginal mucosa moist; + whiff test  Cervix: no cervical lesions, no CMT  Uterus: normal size, shape, consistency  non-tender  Adenexa: no masses, non-tender  TESTS:  Repeat PAP today, CG./Chlamydia, wet prep     SKIN:     Assessment & Plan   Problems addressed today: General Plan & Pt Instructions:  1. Screening for malignant neoplasm of the cervix   2. Vaginitis and  vulvovaginitis, unspecified       You will hear from [place] about your results.  If this is bacterial vaginosis we will call in the cream for you.  Followup as we previously discussed     For further discussion of A/P and for follow up issues see problem based charting if applicable.

## 2013-03-12 ENCOUNTER — Ambulatory Visit (INDEPENDENT_AMBULATORY_CARE_PROVIDER_SITE_OTHER): Payer: Medicaid Other | Admitting: Sports Medicine

## 2013-03-12 VITALS — BP 120/77 | HR 87 | Ht 69.0 in | Wt 194.0 lb

## 2013-03-12 DIAGNOSIS — F419 Anxiety disorder, unspecified: Secondary | ICD-10-CM

## 2013-03-12 DIAGNOSIS — N870 Mild cervical dysplasia: Secondary | ICD-10-CM

## 2013-03-12 DIAGNOSIS — M999 Biomechanical lesion, unspecified: Secondary | ICD-10-CM

## 2013-03-12 DIAGNOSIS — F411 Generalized anxiety disorder: Secondary | ICD-10-CM

## 2013-03-12 DIAGNOSIS — M549 Dorsalgia, unspecified: Secondary | ICD-10-CM

## 2013-03-12 MED ORDER — BUSPIRONE HCL 10 MG PO TABS: 10.0000 mg | ORAL_TABLET | Freq: Two times a day (BID) | ORAL | Status: DC

## 2013-03-12 NOTE — Assessment & Plan Note (Signed)
Right chronic recurrent back pain that is worsened with prolonged standing. OMT as above.  Home exercise regimen provided. OTC NSAIDs x3-5 days if needed.

## 2013-03-12 NOTE — Assessment & Plan Note (Signed)
Patient having difficulty with regular medication adherence. Patient Does not wish to discontinue medications at this time. simplify regimen to 10 mg by mouth twice a day.

## 2013-03-12 NOTE — Progress Notes (Signed)
[facility] - 28 y.o. female MRN [id number]  Date of birth: May 24, 1985  Chief Complaint  Patient presents with  . Follow-up    meds   Problems/Dx addressed during visit General Plan and Pt Instructions  1. CIN I (cervical intraepithelial neoplasia I)   2. Chronic anxiety   3. Nonallopathic lesion   4. Back pain       Buspar 10mg  twice daily  Hip exercises. Start with 5 reps X 3 times per day.  Increase to 15 reps 3X per day over the next 2 weeks 1.   Stretching with foot behind (lean back while standing) 2. Leg swings.  Front leg swings and side     HPI, INTERVAL HISTORY & ROS    She reports overall doing well.  She has had one anxiety attack while at work in the past 3 months.  Otherwise reports feeling well controlled.  Reports only taking 6-7/14 BuSpar doses in the past week.  Denies any adverse effects to BuSpar, desires to continue therapy but does not like taking medications.  Continues to have occasional palpitations while laying down to go to bed.  Denies any dizziness, orthostasis, symptoms lasting longer than 5 seconds or presence during exertion.  She reports her right low back has continued to be bothersome for her.  Not performing any specific exercises.Pt denies any radicular symptoms, change in bowel or bladder habits, muscle weakness or falls associated with back pain.  No fevers, chills, night sweats or weight loss.  Pertinent History & Care Coordination  No Patient Care Coordination Note on file.  History  Smoking status  . Never Smoker   Smokeless tobacco  . Not on file  No health maintenance topics applied.  Recent Labs  05/03/12 1443  TSH 1.567  FREET4 1.09       Otherwise past Medical, Surgical, Social, and Family History Reviewed per EMR Medications and Allergies reviewed and all updated if necessary. Objective Findings  VITALS: HR: 87 bpm  BP: 120/77 mmHg  TEMP:   ( )  RESP:    HT: 5\' 9"  (175.3 cm)  WT: 194 lb (87.998 kg)  BMI: 28.7   BP Readings from Last 3 Encounters:  03/12/13 120/77  12/30/12 122/66  12/02/12 100/68   Wt Readings from Last 3 Encounters:  03/12/13 194 lb (87.998 kg)  12/30/12 188 lb 14.4 oz (85.684 kg)  12/02/12 184 lb 12.8 oz (83.825 kg)     PHYSICAL EXAM: GENERAL:  adult [place] female. In no discomfort; no respiratory distress  PSYCH: alert and appropriate, moderate insight   HNEENT:   CARDIO: RRR, S1/S2 heard, no murmur  LUNGS: CTA B, no wheezes, no crackles  ABDOMEN:   EXTREM:   OSTEOPATHIC EXAM FINDINGS: LEG LENGTH:  left short leg   MYOTOME TESTING  lower extremity strength grossly intact  DERMATOME TESTING  sensation grossly intact    SOMATIC DYSFUNCTION LUMBAR  L2-L5 rotated left side bent right   SACRUM   PELVIS  right anterior innominate   LE   UE     GU:   SKIN:     Medications, Labs & Other Orders   Previous Medications   FLUTICASONE (FLONASE) 50 MCG/ACT NASAL SPRAY    Place 2 sprays into the nose daily.   Modified Medications   Modified Medication Previous Medication   BUSPIRONE (BUSPAR) 10 MG TABLET busPIRone (BUSPAR) 10 MG tablet      Take 1 tablet (10 mg total) by mouth 2 (two) times daily.  Take 1 tablet (10 mg total) by mouth daily. At 1100AM   New Prescriptions   No medications on file   Discontinued Medications   BUSPIRONE (BUSPAR) 15 MG TABLET    Take 1 tablet (15 mg total) by mouth daily. At 1100pm   CLINDAMYCIN (CLEOCIN) 300 MG CAPSULE    Take 1 capsule (300 mg total) by mouth 2 (two) times daily.   FERROUS GLUCONATE (FERGON) 225 (27 FE) MG TABLET    Take 1 tablet (240 mg total) by mouth 3 (three) times daily with meals.   METRONIDAZOLE (METROGEL) 0.75 % VAGINAL GEL    Place 1 Applicatorful vaginally 2 (two) times daily.  No orders of the defined types were placed in this encounter.   Assessment & Plan  As above & for further discussion see problem based charting if applicable.

## 2013-03-12 NOTE — Assessment & Plan Note (Signed)
Decision today to treat with OMT was based on Physical Exam.  Verbal consent was obtained after explanation of risks, benefits and potential side-effects including acute pain flare, post-manipulation soreness, and need for repeat treatments.    The Patient was treated with ME, long lever techniques in Lumbar, pelvis areas, SI.   Patient tolerated the procedure well with minimal improvement in symptoms but improved SI joint motion and lumbar improvement. Patient given medications, exercises, stretches and lifestyle modifications per AVS and verbally.

## 2013-03-12 NOTE — Patient Instructions (Addendum)
   Buspar 10mg  twice daily  Hip exercises. Start with 5 reps X 3 times per day.  Increase to 15 reps 3X per day over the next 2 weeks 1.   Stretching with foot behind (lean back while standing) 2. Leg swings.  Front leg swings and side   If you need anything prior to your next visit please call the clinic. Please Bring all medications or accurate medication list with you to each appointment; an accurate medication list is essential in providing you the best care possible.

## 2013-03-27 ENCOUNTER — Ambulatory Visit: Payer: Medicaid Other

## 2013-04-11 ENCOUNTER — Ambulatory Visit: Payer: Medicaid Other

## 2013-04-17 ENCOUNTER — Ambulatory Visit: Payer: Medicaid Other

## 2013-05-12 ENCOUNTER — Encounter: Payer: Self-pay | Admitting: Sports Medicine

## 2013-05-12 ENCOUNTER — Ambulatory Visit (INDEPENDENT_AMBULATORY_CARE_PROVIDER_SITE_OTHER): Payer: Medicaid Other | Admitting: Sports Medicine

## 2013-05-12 VITALS — BP 117/64 | HR 71 | Temp 98.9°F | Ht 69.0 in | Wt 194.0 lb

## 2013-05-12 DIAGNOSIS — F411 Generalized anxiety disorder: Secondary | ICD-10-CM

## 2013-05-12 DIAGNOSIS — M549 Dorsalgia, unspecified: Secondary | ICD-10-CM

## 2013-05-12 DIAGNOSIS — F419 Anxiety disorder, unspecified: Secondary | ICD-10-CM

## 2013-05-14 ENCOUNTER — Encounter: Payer: Self-pay | Admitting: Sports Medicine

## 2013-05-14 NOTE — Progress Notes (Signed)
  Elizabeth [facility] - 28 y.o. female MRN [id number]  Date of birth: 08/21/1985  CC & SUBJECTIVE:      Chief Complaint  Patient presents with  . Anxiety    follow up  . Back Pain    Right SI, chronic, worse while at work   See problem based charting for additional problem specific subjective (including HPI, Interval History & ROS)   HISTORY: Wt Readings from Last 3 Encounters:  05/12/13 194 lb (87.998 kg)  03/12/13 194 lb (87.998 kg)  12/30/12 188 lb 14.4 oz (85.684 kg)   BP Readings from Last 3 Encounters:  05/12/13 117/64  03/12/13 120/77  12/30/12 122/66    History  Smoking status  . Never Smoker   Smokeless tobacco  . Not on file   There are no preventive care reminders to display for this patient.  Otherwise past Medical, Surgical, Social, and Family History Reviewed per EMR Medications and Allergies reviewed and updated per below.  VITALS: BP 117/64  Pulse 71  Temp(Src) 98.9 F (37.2 C) (Oral)  Ht 5\' 9"  (1.753 m)  Wt 194 lb (87.998 kg)  BMI 28.64 kg/m2  PHYSICAL EXAM: GENERAL:  adult [place] female. In no discomfort; no respiratory distress  PSYCH: alert and appropriate, good insight  No nervous tic       OSTEOPATHIC EXAM FINDINGS: LEG LENGTH:  right short leg   STATIC POSTURE  swayback   GAIT and STATION:  normal   NEURO The bilateral Lower Extremity myotomes are 5+/5.  Sensation is grossly intact to light sensation in bilateral Lower Extremity dermatomes.      SOMATIC DYSFUNCTION CRANIAL   CERVICAL   THORACIC   RIBS   LUMBAR   SACRUM  left on left   PELVIS  right anterior innominate   LE   UE     MEDICATIONS, LABS & OTHER ORDERS: Previous Medications   BUSPIRONE (BUSPAR) 10 MG TABLET    Take 1 tablet (10 mg total) by mouth 2 (two) times daily.   FLUTICASONE (FLONASE) 50 MCG/ACT NASAL SPRAY    Place 2 sprays into the nose daily.   Modified Medications   No medications on file   New Prescriptions   No medications on file   Discontinued  Medications   No medications on file  No orders of the defined types were placed in this encounter.   ASSESSMENT & PLAN: See problem based charting & AVS for pt instructions.

## 2013-05-14 NOTE — Assessment & Plan Note (Signed)
Problem Based Documentation:    Subjective Report:  Reports overall her anxiety level is significantly better than when we first met.  Only taking her BuSpar occasionally.  Denies any side effects.  Significantly improved ability to handle stressful situations she reports is due to counseling and coping techniques including deep breathing     Assessment & Plan & Follow up Issues:  Chronic condition with improved control 1. No changes to medication regimen although she is noncompliant with this it seems to be working 2. Continue with counseling 3. Encouraged routine exercise

## 2013-05-14 NOTE — Assessment & Plan Note (Addendum)
Problem Based Documentation:    Subjective Report:  Pt denies any radicular symptoms, change in bowel or bladder habits, muscle weakness or falls associated with back pain.  No fevers, chills, night sweats or weight loss.  Not using any specific exercises or medications on regular basis.  Reports leg swings and will exercises do help but not performing regularly     Assessment & Plan & Follow up Issues:  Subacute condition - Exam consistent with SI joint dysfunction likely due to core weakness - Patient reported only minimal improvement with prior OMT 1. HEP including continuation of the above and glute bridges + partial squats.  Instructed to work up to 3 sets per day of 15.  Starting with 1 x 5 > Consider OMT again discussed importance of home exercise program  .

## 2013-05-28 ENCOUNTER — Ambulatory Visit (INDEPENDENT_AMBULATORY_CARE_PROVIDER_SITE_OTHER): Payer: Self-pay | Admitting: Sports Medicine

## 2013-05-28 VITALS — BP 125/77 | HR 89 | Temp 99.6°F | Resp 22 | Wt 194.0 lb

## 2013-05-28 DIAGNOSIS — J069 Acute upper respiratory infection, unspecified: Secondary | ICD-10-CM

## 2013-05-28 NOTE — Patient Instructions (Signed)
You have viral infection that will resolve on its own over time.  Typically, symptoms can last for up to 10 days (or more) but should be continually improving after the 5th day.  If you exerpience worsening after the 7th day please call us back.  Please continue to drink plenty of fluids and remember you and everybody in your household need to wash their hands frequently!  Take acetaminophen (Tylenol) 1X500mg  or 2X325mg  tablets every 8 hours  Take Ibuprofen (Advil or Motrin) 2-3 200mg  tablets every 8 hours  Try to alternate the acetaminophen and ibuprofen so that you take one every 4 hours Honey has been shown to help with cough.  You can try mixing  a teaspoon of honey in warm water or caffeine free herbal tea before bedtime. We don't know why, but chicken soup also helps, try it!

## 2013-05-28 NOTE — Progress Notes (Signed)
  Elizabeth Hines - 28 y.o. female MRN [id number]  Date of birth: 1985/10/24  CC: URI   SUBJECTIVE:     HPI Comments: Patient presents with:   URI - cold sx onset yest night; no obj fevers, hot last night.  Thirsty symptoms include congestion, rhinorrhea, and ear pressure  Reports one-day onset of subjective fevers.  Mild body aches, no nausea, vomiting, diarrhea, abdominal pain, dysuria, frequency.  No tachypalpitations but occasional palpitations consistent with her prior episodes.   See problem based charting for additional problem specific subjective (including HPI, Interval History & ROS)   HISTORY: History  Smoking status  . Never Smoker   Smokeless tobacco  . Not on file  Otherwise past Medical, Surgical, Social, and Family History Reviewed per EMR Medications and Allergies reviewed and updated per below.  OBJECTIVE: VITALS: BP: 125/77 mmHg  HR: 89 bpm  TEMP: 99.6 F (37.6 C) (Oral)  RESP: 100 %  HT:    WT: 194 lb (87.998 kg)  BMI:     BP Readings from Last 3 Encounters:  05/28/13 125/77  05/12/13 117/64  03/12/13 120/77    Wt Readings from Last 3 Encounters:  05/28/13 194 lb (87.998 kg)  05/12/13 194 lb (87.998 kg)  03/12/13 194 lb (87.998 kg)     Physical Exam  Vitals reviewed. Constitutional: She is well-developed, well-nourished, and in no distress.  HENT:  Head: Normocephalic and atraumatic.  Right Ear: External ear normal.  Left Ear: External ear normal.  Neck: No JVD present. No tracheal deviation present.  Cardiovascular: Normal rate, regular rhythm and normal heart sounds.  Exam reveals no gallop and no friction rub.   No murmur heard. Pulmonary/Chest: Effort normal and breath sounds normal. No respiratory distress. She has no wheezes. She has no rales. She exhibits no tenderness.  Abdominal: She exhibits no distension. There is no tenderness. There is no rebound.  Musculoskeletal: She exhibits no edema and no tenderness.  Lymphadenopathy:    She has  cervical adenopathy (mild anterior cervical lymphadenopathy).  Neurological: She is alert.  Moves all 4 extremities spontaneously; no lateralization.  Skin: Skin is warm and dry. She is not diaphoretic.  Psychiatric: Mood, memory, affect and judgment normal.   MEDICATIONS, LABS & OTHER ORDERS: Previous Medications   BUSPIRONE (BUSPAR) 10 MG TABLET    Take 1 tablet (10 mg total) by mouth 2 (two) times daily.   FLUTICASONE (FLONASE) 50 MCG/ACT NASAL SPRAY    Place 2 sprays into the nose daily.   Modified Medications   No medications on file   New Prescriptions   No medications on file   Discontinued Medications   No medications on file  No orders of the defined types were placed in this encounter.   ASSESSMENT & PLAN: Encounter Diagnosis  Name Primary?  . Viral URI Yes  Symptomatic treatment per AVS

## 2013-07-21 ENCOUNTER — Telehealth: Payer: Self-pay | Admitting: Sports Medicine

## 2013-07-21 MED ORDER — METRONIDAZOLE 1 % EX GEL: Freq: Every day | CUTANEOUS | Status: DC

## 2013-07-21 NOTE — Telephone Encounter (Signed)
Patient believes she has BV. Patient states she does not have the funds to be seen and would like Dr. Berline Choughigby to send a rx for Meterogel. Please call at [phone]

## 2013-07-23 ENCOUNTER — Telehealth: Payer: Self-pay | Admitting: Family Medicine

## 2013-07-23 NOTE — Telephone Encounter (Signed)
Spoke with patietn and informed her that rx was sent 6/29 to [facility] [place]

## 2013-07-23 NOTE — Telephone Encounter (Signed)
Pt did not receive a response from [facility] about medicine for BV. Please see previous phone note

## 2013-08-12 ENCOUNTER — Ambulatory Visit (INDEPENDENT_AMBULATORY_CARE_PROVIDER_SITE_OTHER): Payer: Self-pay | Admitting: Family Medicine

## 2013-08-12 VITALS — BP 109/70 | HR 58 | Temp 98.4°F | Resp 18 | Wt 192.0 lb

## 2013-08-12 DIAGNOSIS — N760 Acute vaginitis: Secondary | ICD-10-CM

## 2013-08-12 DIAGNOSIS — B373 Candidiasis of vulva and vagina: Secondary | ICD-10-CM

## 2013-08-12 DIAGNOSIS — B3731 Acute candidiasis of vulva and vagina: Secondary | ICD-10-CM

## 2013-08-12 DIAGNOSIS — A499 Bacterial infection, unspecified: Secondary | ICD-10-CM

## 2013-08-12 DIAGNOSIS — B9689 Other specified bacterial agents as the cause of diseases classified elsewhere: Secondary | ICD-10-CM

## 2013-08-12 MED ORDER — METRONIDAZOLE 500 MG PO TABS: 500.0000 mg | ORAL_TABLET | Freq: Three times a day (TID) | ORAL | Status: DC

## 2013-08-12 MED ORDER — FLUCONAZOLE 150 MG PO TABS: 150.0000 mg | ORAL_TABLET | Freq: Once | ORAL | Status: DC

## 2013-08-12 NOTE — Patient Instructions (Signed)
Thank you for coming in, today!  Based on your history and exam, I want to treat you for BV and yeast. I don't think you need other testing, at this time.  I want you to take Diflucan (fluconazole) 150 mg, one pill one time, for the yeast. I want you to take Flagyl (metronidazole) 500 mg, one pill three times a day for 7 days, for the BV. DO NOT drink alcohol while taking metronidazole for 3 days after you finish it. You can take Tylenol as needed for any pain, otherwise.  If your symptoms do not get better, or if new problems come up, like worse pain, discharge, vaginal bleeding, fevers, or vomiting, call the clinic, or go to the emergency room. Otherwise, you can come back to see Dr. Nadine CountsGottschalk as needed. Please feel free to call with any questions or concerns at any time, at [phone]. --Dr. Casper HarrisonStreet

## 2013-08-12 NOTE — Progress Notes (Signed)
   Subjective:    Patient ID: Elizabeth Hines, female    DOB: Mar 01, 1985, 28 y.o.   MRN: [id number]  HPI: Pt presents to clinic for SDA for vaginal discharge for about 2 weeks. Pt reports her discharge is light, with yellowish-white color and odor. She also has had crampy intermittent abdominal pain with it, as well as significant itching. She has had yeast infections in the past with similar symptoms but not the odor, and she has had BV in the past, as well. She has NO urinary symptoms, no fever / chills, N/V, or change in bowel habits. She feels otherwise well.  Pt reports her last menstrual period was 07/16/13 and her last intercourse (does not remember if it was protected or not) was in early June. She had a normal Pap smear in December, with a history of abnormal Pap testing prior to that.  Review of Systems: As above.     Objective:   Physical Exam BP 109/70  Pulse 58  Temp(Src) 98.4 F (36.9 C) (Oral)  Resp 18  Wt 192 lb (87.091 kg)  SpO2 100%  LMP 07/16/2013 Gen: well-appearing adult female in NAD HEENT: [place]/AT, EOMI, PERRLA, MMM Cardio: RRR, no murmur appreciated Pulm: CTAB, no wheezes Abd: soft, nontender, BS+ GU: speculum exam: Normal external vaginal / vulvar structures and no blood or frank cervical bleeding noted    Cervix without surface lesions, with moderate amount whitish discharge present in vault with mucous    Moderate fishy odor noted without swab samples taken  Bimanual exam: exam limited by pt discomfort, but no definite adnexal, fundal, or cervical motion tenderness     Assessment & Plan:  28yo female with presumed yeast vaginitis and BV - no urinary complaints to suggest UTI - no sexual contact for 1 month and menses occurred after last sexual contact, so doubt pregnancy or STI  Plan: - Rx for Diflucan 150 mg once and Flagyl 500 mg TID for 7 days - f/u with PCP as needed - could consider STI check if symptoms do not improve with current treatment, or if they  worsen or if new symptoms arise - reviewed red flags that would prompt immediate return to care, including worse abdominal pain, worse discharge, high fever, vomiting, etc - note routed FYI to Dr. Corrie DandyGottschalk  Eyad Rochford M Nyzir Dubois, MD PGY-3, [facility] 08/12/2013, 12:47 PM

## 2013-08-13 ENCOUNTER — Ambulatory Visit: Payer: Medicaid Other | Admitting: Family Medicine

## 2013-11-07 ENCOUNTER — Other Ambulatory Visit: Payer: Self-pay

## 2013-11-27 ENCOUNTER — Ambulatory Visit (INDEPENDENT_AMBULATORY_CARE_PROVIDER_SITE_OTHER): Payer: Self-pay | Admitting: Family Medicine

## 2013-11-27 ENCOUNTER — Encounter: Payer: Self-pay | Admitting: Family Medicine

## 2013-11-27 VITALS — BP 119/49 | HR 63 | Temp 98.1°F | Ht 69.0 in | Wt 193.6 lb

## 2013-11-27 DIAGNOSIS — J3089 Other allergic rhinitis: Secondary | ICD-10-CM

## 2013-11-27 MED ORDER — NORGESTIMATE-ETH ESTRADIOL 0.25-35 MG-MCG PO TABS: 1.0000 | ORAL_TABLET | Freq: Every day | ORAL | Status: DC

## 2013-11-27 MED ORDER — FLUTICASONE PROPIONATE 50 MCG/ACT NA SUSP: 2.0000 | Freq: Every day | NASAL | Status: DC

## 2013-11-27 NOTE — Progress Notes (Signed)
   Subjective:    Patient ID: Elizabeth Hines, female    DOB: 08/05/1985, 28 y.o.   MRN: [id number]  HPI 28 year old female with anxiety presents to discuss contraception.  1) Contraception  Patient currently sexually active with female partner and using condoms for contraception.  Patient desires contraception as she does not get pregnant at this time.  Patient would like to discuss options today and is contemplating OCPs.  Patient has no complaint at this time. Regarding her menstrual cycles, she states they are regular.  She has some dysmenorrhea.    No recent fever, chills, nausea, vomiting.  Review of Systems  Per HPI    Objective:   Physical Exam Filed Vitals:   11/27/13 0925  BP: 119/49  Pulse: 63  Temp: 98.1 F (36.7 C)   Exam: General: well appearing female in NAD. Appears anxious. Psych: Appears anxious.  Flat affect.      Assessment & Plan:  See Problem List

## 2013-11-27 NOTE — Patient Instructions (Signed)
It was nice to see you today.  I have sent a prescription to your pharmacy.  Please follow-up with her PCP as indicated.  Take care  Dr. Adriana Simasook

## 2014-02-02 ENCOUNTER — Ambulatory Visit (INDEPENDENT_AMBULATORY_CARE_PROVIDER_SITE_OTHER): Payer: Medicaid Other | Admitting: Family Medicine

## 2014-02-02 ENCOUNTER — Other Ambulatory Visit (HOSPITAL_COMMUNITY)
Admission: RE | Admit: 2014-02-02 | Discharge: 2014-02-02 | Disposition: A | Discharge status: Home / Self Care | Payer: Medicaid Other | Source: Ambulatory Visit | Attending: Family Medicine | Admitting: Family Medicine

## 2014-02-02 ENCOUNTER — Encounter: Payer: Self-pay | Admitting: Family Medicine

## 2014-02-02 VITALS — BP 117/61 | HR 55 | Temp 98.2°F | Ht 69.0 in | Wt 192.5 lb

## 2014-02-02 DIAGNOSIS — J302 Other seasonal allergic rhinitis: Secondary | ICD-10-CM

## 2014-02-02 DIAGNOSIS — J309 Allergic rhinitis, unspecified: Secondary | ICD-10-CM | POA: Insufficient documentation

## 2014-02-02 DIAGNOSIS — N870 Mild cervical dysplasia: Secondary | ICD-10-CM

## 2014-02-02 DIAGNOSIS — Z0000 Encounter for general adult medical examination without abnormal findings: Secondary | ICD-10-CM

## 2014-02-02 DIAGNOSIS — L298 Other pruritus: Secondary | ICD-10-CM

## 2014-02-02 DIAGNOSIS — Z113 Encounter for screening for infections with a predominantly sexual mode of transmission: Secondary | ICD-10-CM | POA: Insufficient documentation

## 2014-02-02 DIAGNOSIS — R002 Palpitations: Secondary | ICD-10-CM | POA: Insufficient documentation

## 2014-02-02 DIAGNOSIS — Z13220 Encounter for screening for lipoid disorders: Secondary | ICD-10-CM

## 2014-02-02 DIAGNOSIS — Z3009 Encounter for other general counseling and advice on contraception: Secondary | ICD-10-CM

## 2014-02-02 DIAGNOSIS — N898 Other specified noninflammatory disorders of vagina: Secondary | ICD-10-CM | POA: Insufficient documentation

## 2014-02-02 LAB — LIPID PANEL
Cholesterol: 156 mg/dL (ref 0–200)
HDL: 64 mg/dL (ref >39)
LDL Cholesterol: 82 mg/dL (ref 0–99)
Total CHOL/HDL Ratio: 2.4 Ratio
Triglycerides: 52 mg/dL (ref <150)
VLDL: 10 mg/dL (ref 0–40)

## 2014-02-02 LAB — COMPREHENSIVE METABOLIC PANEL
ALK PHOS: 87 U/L (ref 39–117)
ALT: 9 U/L (ref 0–35)
AST: 17 U/L (ref 0–37)
Albumin: 3.8 g/dL (ref 3.5–5.2)
BUN: 10 mg/dL (ref 6–23)
CALCIUM: 8.9 mg/dL (ref 8.4–10.5)
CO2: 25 mEq/L (ref 19–32)
CREATININE: 0.71 mg/dL (ref 0.50–1.10)
Chloride: 105 mEq/L (ref 96–112)
Glucose, Bld: 83 mg/dL (ref 70–99)
Potassium: 3.8 mEq/L (ref 3.5–5.3)
SODIUM: 137 meq/L (ref 135–145)
Total Bilirubin: 0.5 mg/dL (ref 0.2–1.2)
Total Protein: 7.2 g/dL (ref 6.0–8.3)

## 2014-02-02 LAB — POCT WET PREP (WET MOUNT): Clue Cells Wet Prep Whiff POC: NEGATIVE

## 2014-02-02 LAB — TSH: TSH: 0.962 u[IU]/mL (ref 0.350–4.500)

## 2014-02-02 MED ORDER — CETIRIZINE HCL 10 MG PO TABS: 10.0000 mg | ORAL_TABLET | Freq: Every day | ORAL | Status: DC

## 2014-02-02 NOTE — Assessment & Plan Note (Signed)
Patient with a new partner of 6 months.  Heterosexual monogomous relationship. Reports condom use EVERY TIME. -Declines OCP/hormonal contraception at this time.

## 2014-02-02 NOTE — Assessment & Plan Note (Signed)
No abnormalities or discharge on exam. No dysuria or abnormal vaginal discharge per patient. -Wet prep negative for yeast

## 2014-02-02 NOTE — Progress Notes (Addendum)
Patient ID: Elizabeth Hines, female   DOB: 09-21-1985, 29 y.o.   MRN: [id number] Subjective:     Elizabeth Ponderara Mulrooney is a 29 y.o. female and is here for a comprehensive physical exam. The patient reports problems - Cough that began about 3 months ago when the seasons changed.  She states that cough is not productive and she has not had fevers.  She tried Flonase, which she feels made cough worse.  She has never tried an antihistamine.  She does report waking up with congestion in the mornings.  Denies any bloody sputum, CP, SOB, wheeze. No caffeine use or drug use.  History   Social History  . Marital Status: Single    Spouse Name: N/A    Number of Children: N/A  . Years of Education: N/A   Occupational History  . Not on file.   Social History Main Topics  . Smoking status: Never Smoker   . Smokeless tobacco: Not on file  . Alcohol Use: No  . Drug Use: No  . Sexual Activity: Yes   Other Topics Concern  . Not on file   Social History Narrative   Single   Works at [employer] in [employer] - [place] in [employer]   2nd Job at [employer] in [employer]   Health Maintenance  Topic Date Due  . PAP SMEAR  12/30/2013  . INFLUENZA VACCINE  04/23/2014 (Originally 08/23/2013)  . TETANUS/TDAP  10/30/2020    The following portions of the patient's history were reviewed and updated as appropriate: allergies, current medications, past family history, past medical history, past social history, past surgical history and problem list.  Lives with mother.   Review of Systems Constitutional: negative Eyes: negative Ears, nose, mouth, throat, and face: positive for nasal congestion Respiratory: positive for cough Cardiovascular: positive for palpitations Gastrointestinal: negative Genitourinary:positive for vaginal pruritis Integument/breast: negative Hematologic/lymphatic: negative Musculoskeletal:negative Neurological: negative Behavioral/Psych: negative Endocrine:  negative Allergic/Immunologic: negative   Objective:    BP 117/61 mmHg  Pulse 55  Temp(Src) 98.2 F (36.8 C) (Oral)  Ht 5\' 9"  (1.753 m)  Wt 192 lb 8 oz (87.317 kg)  BMI 28.41 kg/m2  LMP 01/10/2014 General appearance: alert, cooperative, appears stated age and no distress Head: Normocephalic, without obvious abnormality, atraumatic, no TTP to sinus Eyes: conjunctivae/corneas clear. PERRL, EOM's intact. Fundi benign. Ears: normal TM and external ear canal right ear and abnormal external canal left ear - not visualized secondary to cerumen Nose: clear discharge, moderate congestion, turbinates red, swollen, edematous, no sinus tenderness, no polyps, no crusting or bleeding points Throat: lips, mucosa, and tongue normal; teeth and gums normal Neck: no adenopathy, no carotid bruit, no JVD, supple, symmetrical, trachea midline and thyroid not enlarged, symmetric, no tenderness/mass/nodules Back: symmetric, no curvature. ROM normal. No CVA tenderness. Lungs: clear to auscultation bilaterally Heart: regular rate and rhythm, S1, S2 normal, no murmur, click, rub or gallop Abdomen: soft, non-tender; bowel sounds normal; no masses,  no organomegaly Pelvic: cervix normal in appearance, external genitalia normal, no adnexal masses or tenderness, no cervical motion tenderness, rectovaginal septum normal, uterus normal size, shape, and consistency and vagina normal without discharge Extremities: extremities normal, atraumatic, no cyanosis or edema Pulses: 2+ and symmetric Skin: Skin color, texture, turgor normal. No rashes or lesions Lymph nodes: Cervical, supraclavicular, and axillary nodes normal. Neurologic: Alert and oriented X 3, normal strength and tone. Normal symmetric reflexes. Normal coordination and gait    Results for orders placed or performed in visit on 02/02/14 (  from the past 24 hour(s))  POCT Wet Prep [facility] [place])     Status: None   Collection Time: 02/02/14 10:41 AM  Result Value  Ref Range   Source Wet Prep POC vag    WBC, Wet Prep HPF POC 10-20    Bacteria Wet Prep HPF POC 3+ rods    Clue Cells Wet Prep HPF POC None    Clue Cells Wet Prep Whiff POC Negative Whiff    Yeast Wet Prep HPF POC None    Trichomonas Wet Prep HPF POC None     Assessment:    Healthy female exam.      Plan:      STI screen: RPR, HIV, GC/Chlamydia, Wet prep Cerumen impaction L ear: use Debrox as directed.  See After Visit Summary for Counseling Recommendations

## 2014-02-02 NOTE — Assessment & Plan Note (Signed)
Patient with complaints of intermittent, random heart palpitations.  No syncope, dizziness, CP, SOB.  No abnormalities appreciated on exam.  Episodes not interfering with ADLs -TSH, CMET ordered -Patient to follow up if she develops red flag signs, which were reviewed with patient

## 2014-02-02 NOTE — Assessment & Plan Note (Signed)
Patient previously treated with Flonase with poor relief. Complaining of cough likely 2/2 allergic rhinitis.  On exam, erythematous, edematous nasal turbinates. No fevers, SOB, headache. -Zyrtec po qd -Follow up PRN

## 2014-02-02 NOTE — Patient Instructions (Signed)
It was a pleasure seeing you today!  Information regarding what we discussed is included in this packet.  Please feel free to call our office if any questions or concerns arise.  I will contact you with the results of your labs.  If any are abnormal, I will call you.  Otherwise, you will receive a letter in the mail.  Use Debrox ear drops for the wax in your ears.  A prescription for Zyrtec has been sent into your pharmacy.  See me back in 1 year for your physical or sooner if needed.  Ashly M. Lajuana Ripple, DO PGY-1, [facility]  Allergies  Allergies may happen from anything your body is sensitive to. This may be food, medicines, pollens, chemicals, and many other things. Food allergies can be severe and deadly.  HOME CARE  If you do not know what causes a reaction, keep a diary. Write down the foods you ate and the symptoms that followed. Avoid foods that cause reactions.  If you have red raised spots (hives) or a rash:  Take medicine as told by your doctor.  Use medicines for red raised spots and itching as needed.  Apply cold cloths (compresses) to the skin. Take a cool bath. Avoid hot baths or showers.  If you are severely allergic:  It is often necessary to go to the hospital after you have treated your reaction.  Wear your medical alert jewelry.  You and your family must learn how to give a allergy shot or use an allergy kit (anaphylaxis kit).  Always carry your allergy kit or shot with you. Use this medicine as told by your doctor if a severe reaction is occurring. GET HELP RIGHT AWAY IF:  You have trouble breathing or are making high-pitched whistling sounds (wheezing).  You have a tight feeling in your chest or throat.  You have a puffy (swollen) mouth.  You have red raised spots, puffiness (swelling), or itching all over your body.  You have had a severe reaction that was helped by your allergy kit or shot. The reaction can return once the medicine has worn  off.  You think you are having a food allergy. Symptoms most often happen within 30 minutes of eating a food.  Your symptoms have not gone away within 2 days or are getting worse.  You have new symptoms.  You want to retest yourself with a food or drink you think causes an allergic reaction. Only do this under the care of a doctor. MAKE SURE YOU:   Understand these instructions.  Will watch your condition.  Will get help right away if you are not doing well or get worse. Document Released: 05/06/2012 Document Reviewed: 05/06/2012 [facility] Patient Information 2015 [place]. This information is not intended to replace advice given to you by your health care provider. Make sure you discuss any questions you have with your health care provider.    Health Maintenance Adopting a healthy lifestyle and getting preventive care can go a long way to promote health and wellness. Talk with your health care provider about what schedule of regular examinations is right for you. This is a good chance for you to check in with your provider about disease prevention and staying healthy. In between checkups, there are plenty of things you can do on your own. Experts have done a lot of research about which lifestyle changes and preventive measures are most likely to keep you healthy. Ask your health care provider for more information.  WEIGHT AND DIET  Eat a healthy diet  Be sure to include plenty of vegetables, fruits, low-fat dairy products, and lean protein.  Do not eat a lot of foods high in solid fats, added sugars, or salt.  Get regular exercise. This is one of the most important things you can do for your health.  Most adults should exercise for at least 150 minutes each week. The exercise should increase your heart rate and make you sweat (moderate-intensity exercise).  Most adults should also do strengthening exercises at least twice a week. This is in addition to the moderate-intensity  exercise.  Maintain a healthy weight  Body mass index (BMI) is a measurement that can be used to identify possible weight problems. It estimates body fat based on height and weight. Your health care provider can help determine your BMI and help you achieve or maintain a healthy weight.  For females 30 years of age and older:   A BMI below 18.5 is considered underweight.  A BMI of 18.5 to 24.9 is normal.  A BMI of 25 to 29.9 is considered overweight.  A BMI of 30 and above is considered obese.  Watch levels of cholesterol and blood lipids  You should start having your blood tested for lipids and cholesterol at 29 years of age, then have this test every 5 years.  You may need to have your cholesterol levels checked more often if:  Your lipid or cholesterol levels are high.  You are older than 29 years of age.  You are at high risk for heart disease.  CANCER SCREENING   Lung Cancer  Lung cancer screening is recommended for adults 34-70 years old who are at high risk for lung cancer because of a history of smoking.  A yearly low-dose CT scan of the lungs is recommended for people who:  Currently smoke.  Have quit within the past 15 years.  Have at least a 30-pack-year history of smoking. A pack year is smoking an average of one pack of cigarettes a day for 1 year.  Yearly screening should continue until it has been 15 years since you quit.  Yearly screening should stop if you develop a health problem that would prevent you from having lung cancer treatment.  Breast Cancer  Practice breast self-awareness. This means understanding how your breasts normally appear and feel.  It also means doing regular breast self-exams. Let your health care provider know about any changes, no matter how small.  If you are in your 20s or 30s, you should have a clinical breast exam (CBE) by a health care provider every 1-3 years as part of a regular health exam.  If you are 4 or  older, have a CBE every year. Also consider having a breast X-ray (mammogram) every year.  If you have a family history of breast cancer, talk to your health care provider about genetic screening.  If you are at high risk for breast cancer, talk to your health care provider about having an MRI and a mammogram every year.  Breast cancer gene (BRCA) assessment is recommended for women who have family members with BRCA-related cancers. BRCA-related cancers include:  Breast.  Ovarian.  Tubal.  Peritoneal cancers.  Results of the assessment will determine the need for genetic counseling and BRCA1 and BRCA2 testing. Cervical Cancer Routine pelvic examinations to screen for cervical cancer are no longer recommended for nonpregnant women who are considered low risk for cancer of the pelvic organs (ovaries, uterus,  and vagina) and who do not have symptoms. A pelvic examination may be necessary if you have symptoms including those associated with pelvic infections. Ask your health care provider if a screening pelvic exam is right for you.   The Pap test is the screening test for cervical cancer for women who are considered at risk.  If you had a hysterectomy for a problem that was not cancer or a condition that could lead to cancer, then you no longer need Pap tests.  If you are older than 65 years, and you have had normal Pap tests for the past 10 years, you no longer need to have Pap tests.  If you have had past treatment for cervical cancer or a condition that could lead to cancer, you need Pap tests and screening for cancer for at least 20 years after your treatment.  If you no longer get a Pap test, assess your risk factors if they change (such as having a new sexual partner). This can affect whether you should start being screened again.  Some women have medical problems that increase their chance of getting cervical cancer. If this is the case for you, your health care provider may  recommend more frequent screening and Pap tests.  The human papillomavirus (HPV) test is another test that may be used for cervical cancer screening. The HPV test looks for the virus that can cause cell changes in the cervix. The cells collected during the Pap test can be tested for HPV.  The HPV test can be used to screen women 1 years of age and older. Getting tested for HPV can extend the interval between normal Pap tests from three to five years.  An HPV test also should be used to screen women of any age who have unclear Pap test results.  After 29 years of age, women should have HPV testing as often as Pap tests.  Colorectal Cancer  This type of cancer can be detected and often prevented.  Routine colorectal cancer screening usually begins at 29 years of age and continues through 29 years of age.  Your health care provider may recommend screening at an earlier age if you have risk factors for colon cancer.  Your health care provider may also recommend using home test kits to check for hidden blood in the stool.  A small camera at the end of a tube can be used to examine your colon directly (sigmoidoscopy or colonoscopy). This is done to check for the earliest forms of colorectal cancer.  Routine screening usually begins at age 55.  Direct examination of the colon should be repeated every 5-10 years through 29 years of age. However, you may need to be screened more often if early forms of precancerous polyps or small growths are found. Skin Cancer  Check your skin from head to toe regularly.  Tell your health care provider about any new moles or changes in moles, especially if there is a change in a mole's shape or color.  Also tell your health care provider if you have a mole that is larger than the size of a pencil eraser.  Always use sunscreen. Apply sunscreen liberally and repeatedly throughout the day.  Protect yourself by wearing long sleeves, pants, a wide-brimmed hat,  and sunglasses whenever you are outside. HEART DISEASE, DIABETES, AND HIGH BLOOD PRESSURE   Have your blood pressure checked at least every 1-2 years. High blood pressure causes heart disease and increases the risk of stroke.  If  you are between 21 years and 37 years old, ask your health care provider if you should take aspirin to prevent strokes.  Have regular diabetes screenings. This involves taking a blood sample to check your fasting blood sugar level.  If you are at a normal weight and have a low risk for diabetes, have this test once every three years after 29 years of age.  If you are overweight and have a high risk for diabetes, consider being tested at a younger age or more often. PREVENTING INFECTION  Hepatitis B  If you have a higher risk for hepatitis B, you should be screened for this virus. You are considered at high risk for hepatitis B if:  You were born in a country where hepatitis B is common. Ask your health care provider which countries are considered high risk.  Your parents were born in a high-risk country, and you have not been immunized against hepatitis B (hepatitis B vaccine).  You have HIV or AIDS.  You use needles to inject street drugs.  You live with someone who has hepatitis B.  You have had sex with someone who has hepatitis B.  You get hemodialysis treatment.  You take certain medicines for conditions, including cancer, organ transplantation, and autoimmune conditions. Hepatitis C  Blood testing is recommended for:  Everyone born from [birth year] through 1965.  Anyone with known risk factors for hepatitis C. Sexually transmitted infections (STIs)  You should be screened for sexually transmitted infections (STIs) including gonorrhea and chlamydia if:  You are sexually active and are younger than 29 years of age.  You are older than 29 years of age and your health care provider tells you that you are at risk for this type of infection.  Your  sexual activity has changed since you were last screened and you are at an increased risk for chlamydia or gonorrhea. Ask your health care provider if you are at risk.  If you do not have HIV, but are at risk, it may be recommended that you take a prescription medicine daily to prevent HIV infection. This is called pre-exposure prophylaxis (PrEP). You are considered at risk if:  You are sexually active and do not regularly use condoms or know the HIV status of your partner(s).  You take drugs by injection.  You are sexually active with a partner who has HIV. Talk with your health care provider about whether you are at high risk of being infected with HIV. If you choose to begin PrEP, you should first be tested for HIV. You should then be tested every 3 months for as long as you are taking PrEP.  PREGNANCY   If you are premenopausal and you may become pregnant, ask your health care provider about preconception counseling.  If you may become pregnant, take 400 to 800 micrograms (mcg) of folic acid every day.  If you want to prevent pregnancy, talk to your health care provider about birth control (contraception). OSTEOPOROSIS AND MENOPAUSE   Osteoporosis is a disease in which the bones lose minerals and strength with aging. This can result in serious bone fractures. Your risk for osteoporosis can be identified using a bone density scan.  If you are 65 years of age or older, or if you are at risk for osteoporosis and fractures, ask your health care provider if you should be screened.  Ask your health care provider whether you should take a calcium or vitamin D supplement to lower your risk for osteoporosis.  Menopause may have certain physical symptoms and risks.  Hormone replacement therapy may reduce some of these symptoms and risks. Talk to your health care provider about whether hormone replacement therapy is right for you.  HOME CARE INSTRUCTIONS   Schedule regular health, dental, and  eye exams.  Stay current with your immunizations.   Do not use any tobacco products including cigarettes, chewing tobacco, or electronic cigarettes.  If you are pregnant, do not drink alcohol.  If you are breastfeeding, limit how much and how often you drink alcohol.  Limit alcohol intake to no more than 1 drink per day for nonpregnant women. One drink equals 12 ounces of beer, 5 ounces of wine, or 1 ounces of hard liquor.  Do not use street drugs.  Do not share needles.  Ask your health care provider for help if you need support or information about quitting drugs.  Tell your health care provider if you often feel depressed.  Tell your health care provider if you have ever been abused or do not feel safe at home. Document Released: 07/25/2010 Document Revised: 05/26/2013 Document Reviewed: 12/11/2012 [facility] Patient Information 2015 [place], [place]. This information is not intended to replace advice given to you by your health care provider. Make sure you discuss any questions you have with your health care provider.

## 2014-02-02 NOTE — Assessment & Plan Note (Signed)
Patient with normal pap in 12/30/2012.  Repeat in 2017 with co-testing.

## 2014-02-02 NOTE — Assessment & Plan Note (Signed)
Declines flu shot

## 2014-02-03 ENCOUNTER — Telehealth: Payer: Self-pay | Admitting: Family Medicine

## 2014-02-03 LAB — RPR

## 2014-02-03 LAB — CERVICOVAGINAL ANCILLARY ONLY
CHLAMYDIA, DNA PROBE: NEGATIVE
Neisseria Gonorrhea: NEGATIVE

## 2014-02-03 LAB — HIV ANTIBODY (ROUTINE TESTING W REFLEX): HIV: NONREACTIVE

## 2014-02-03 NOTE — Telephone Encounter (Signed)
Called to let know lab results thus far are within normal limits.  Awaiting GC/Clx results then will send letter in mail with labs.  Patient voices appreciation for call.  Ashly M. Nadine CountsGottschalk, DO PGY-1, [facility]

## 2014-02-04 ENCOUNTER — Encounter: Payer: Self-pay | Admitting: Family Medicine

## 2014-10-27 ENCOUNTER — Ambulatory Visit: Payer: Medicaid Other | Admitting: Family Medicine

## 2014-10-30 ENCOUNTER — Ambulatory Visit (INDEPENDENT_AMBULATORY_CARE_PROVIDER_SITE_OTHER): Payer: Self-pay | Admitting: Family Medicine

## 2014-10-30 ENCOUNTER — Encounter: Payer: Self-pay | Admitting: Family Medicine

## 2014-10-30 VITALS — BP 115/65 | HR 60 | Temp 98.7°F | Ht 69.0 in | Wt 200.2 lb

## 2014-10-30 DIAGNOSIS — R002 Palpitations: Secondary | ICD-10-CM

## 2014-10-30 DIAGNOSIS — I498 Other specified cardiac arrhythmias: Secondary | ICD-10-CM | POA: Insufficient documentation

## 2014-10-30 DIAGNOSIS — F419 Anxiety disorder, unspecified: Secondary | ICD-10-CM

## 2014-10-30 MED ORDER — BUSPIRONE HCL 15 MG PO TABS: 15.0000 mg | ORAL_TABLET | Freq: Two times a day (BID) | ORAL | Status: DC

## 2014-10-30 NOTE — Progress Notes (Signed)
Subjective:    Elizabeth Hines is a 29 y.o. female who presents to [facility] today for anxiety:  1.  Anxiety/panic attacks:  History of the same in 2013/2014.  Has been well since then without any treatment.  Prior symptoms were palpitations described as "fluttering in my chest," sense of impeneing doom, moderate to severe anxiety.  Would last 15 minutes or less and resolve with deep breathing.  Previously treated with Buspar for this.   Started on 10 mg and eventually increased dose to 15 mg. She has been fine off BuSpar for past 2 years or so.  This past Sunday she began having recurrence of her symptoms. She described "fluttering in her chest" that lasted for about 10 minutes. She was not able to calm herself with deep breaths or thoughts. However this did resolve on its own. She is not lightheaded during symptoms. She had no associated chest pain, no dyspnea. She's not had increase in menstrual bleeding recently. No heat or cold intolerance.   ROS as above per HPI, otherwise neg.  The following portions of the patient's history were reviewed and updated as appropriate: allergies, current medications, past medical history, family and social history, and problem list. Patient is a nonsmoker.    PMH reviewed.  Past Medical History  Diagnosis Date  . Vaginal discharge 10/31/2010  . Trichomonas     10/14/07; 11/25/07; 06/01/08  . Allergic rhinitis 06/11/2012  . Anemia 06/11/2012  . Palpitations 12/19/2011  . Paresthesias of bilateral upper limbs, worse on right 04/09/2012   No past surgical history on file.  Medications reviewed. Current Outpatient Prescriptions  Medication Sig Dispense Refill  . cetirizine (ZYRTEC) 10 MG tablet Take 1 tablet (10 mg total) by mouth daily. 30 tablet 11  . fluticasone (FLONASE) 50 MCG/ACT nasal spray Place 2 sprays into both nostrils daily. (Patient not taking: Reported on 02/02/2014) 16 g 6   No current facility-administered medications for this visit.      Objective:   Physical Exam BP 115/65 mmHg  Pulse 60  Temp(Src) 98.7 F (37.1 C) (Oral)  Ht  (1.753 m)  Wt 200 lb 3.2 oz (90.81 kg)  BMI 29.55 kg/m2  LMP 09/29/2014 Gen:  Alert, cooperative patient who appears stated age in no acute distress.  Vital signs reviewed. HEENT: EOMI,  MMM Neck: Supple without thyromegaly.  Cardiac:  Regular rate and rhythm without murmur auscultated.  Good S1/S2. Pulm:  Clear to auscultation bilaterally with good air movement.  No wheezes or rales noted.   X-rays: No lower extremity edema.    No results found for this or any previous visit (from the past 72 hour(s)).

## 2014-10-30 NOTE — Assessment & Plan Note (Signed)
GAD 7 today is score of 16. This is increased from last score of 12. We will restart BuSpar. She is most willing to do this after discussion. She did have some fatigue and sleepiness with the dose of 15 mg. Several started 15 mg by mouth twice a day and she can decrease this to 7.5 mg twice a day if she becomes too sleepy. -Follow-up in about a month to assess for improvement.

## 2014-10-30 NOTE — Patient Instructions (Addendum)
It was good to meet you today.    Let's try the Buspar again.  Let me know if you are having any issues with this.      Have a good weekend.

## 2014-10-30 NOTE — Assessment & Plan Note (Signed)
If this becomes a recurrent issue, torsion should follow-up sooner than one month. She should have an event monitor placed and recheck her TSH.  Again if you're having any increase in her palpitations/fluttering in her chest don't wait for the month but come back sooner.jk[phjpj

## 2014-11-04 ENCOUNTER — Telehealth: Payer: Self-pay | Admitting: Family Medicine

## 2014-11-04 NOTE — Telephone Encounter (Signed)
Need to find out what kind of pain medication OTC she can take while taking buspirone

## 2014-11-04 NOTE — Telephone Encounter (Signed)
Buspar has very little interactions.  She is free to take Ibuprofen, Naproxen or Tylenol if she wishes.  Please relay this to patient.

## 2014-11-04 NOTE — Telephone Encounter (Signed)
Spoke to pt. Informed her of the information below. Pt has good understanding. Sunday SpillersSharon T Kateryn Marasigan, CMA

## 2014-12-04 ENCOUNTER — Encounter: Payer: Self-pay | Admitting: Family Medicine

## 2014-12-04 ENCOUNTER — Ambulatory Visit (INDEPENDENT_AMBULATORY_CARE_PROVIDER_SITE_OTHER): Payer: Self-pay | Admitting: Family Medicine

## 2014-12-04 VITALS — BP 122/68 | HR 74 | Temp 98.5°F | Ht 69.5 in | Wt 200.1 lb

## 2014-12-04 DIAGNOSIS — M545 Low back pain, unspecified: Secondary | ICD-10-CM

## 2014-12-04 DIAGNOSIS — F419 Anxiety disorder, unspecified: Secondary | ICD-10-CM

## 2014-12-04 NOTE — Progress Notes (Signed)
    Subjective: CC: anxiety, back pain HPI: Patient is a 29 y.o. female presenting to clinic today for office visit. Concerns today include:  1. GAD Patient notes that 1/2 tablet daily of Buspar is working well at this time for her.  She notes that palpitations have decreased significantly.  Only having them 1-2 times daily.  Is reluctant to increase Buspar at this time because she had adverse effects on 15 mg previously.  Sleep is fair.  2. Back pain Patient notes that she has had a tightness in her R back for some time.  She used to have OMM that helped with this.  She has been using hot baths with some relief.  She would like to get OMM again if possible.  No numbness, tingling, weakness, falls.    Social History Reviewed: non smoker. FamHx and MedHx updated.  Please see EMR.  ROS: Per HPI  Objective: Office vital signs reviewed. BP 122/68 mmHg  Pulse 74  Temp(Src) 98.5 F (36.9 C) (Oral)  Ht 5' 9.5" (1.765 m)  Wt 200 lb 1.6 oz (90.765 kg)  BMI 29.14 kg/m2  LMP 11/04/2014  Physical Examination:  General: Awake, alert, well nourished, No acute distress HEENT: Normal, MMM Cardio: regular rate, + 2 radial pulses Pulm: normal WOB, no wheeze Extremities: warm, well perfused, No edema, cyanosis or clubbing, FROM  Spine: no midline TTP, increased tonicity of right paraspinal musculature in lumbar region  Right lower extremity: slightly longer than left, anterior Right hip, no inflare or outflare of hip appreciated. MSK: Normal gait and station Neuro: Strength and sensation grossly intact Psych: somewhat anxious appearing, speech normal, good eye contact, thought process normal, GAD 7 score: 17  Verbal consent for OMM provided by patient: Muscle energy for anterior hip performed.  Recheck of hip revealed improvement.  Assessment/ Plan: 29 y.o. female   1. Chronic anxiety.  Patient feels that she is doing better on Buspar.  Though anxious about taking medication for her  back.  She is worried about interactions with Buspar.  GAD 7 score 16, unchanged from previous, but patient reports feeling better.  Improvement in palpitations. - Recommended that patient take 1 full tablet at bedtime and continue 1/2 tablet each am for now.  She is to consider increasing to 15 mg twice a day.   - List of medications that may interact with Buspar provided, See AFTER VISIT SUMMARY - Patient to return in 4-6 weeks or sooner if needed.  2. Right-sided low back pain without sciatica.  Likely from repetitive movements at work.  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 No sensation changes or weakness. NO red flags. - Muscle energy for anterior hip performed.  Recheck of hip revealed improvement. - Recommended continuing aleve AS NEEDED  - Patient to consider scheduling routine OMM treatments either with myself or Sports Medic[facility]   Raliegh IpAshly M Gottschalk, DO PGY-2, [facility]

## 2014-12-04 NOTE — Assessment & Plan Note (Signed)
Patient feels that she is doing better on Buspar. Though anxious about taking medication for her back. She is worried about interactions with Buspar. GAD 7 score 16, unchanged from previous, but patient reports feeling better. Improvement in palpitations.  - Recommended that patient take 1 full tablet at bedtime and continue 1/2 tablet each am for now. She is to consider increasing to 15 mg twice a day.  - List of medications that may interact with Buspar provided, See AFTER VISIT SUMMARY  - Patient to return in 4-6 weeks or sooner if needed.

## 2014-12-04 NOTE — Patient Instructions (Signed)
Consider taking a whole tablet at night time of the Buspar.  You may find that your symptoms improve.  You can take Aleve, Naproxen, Motrin, Advil or Ibuprofen safely with this medication.  If you are ever in doubt, ask your pharmacist.  Buspar interacts with very few medications.  Plan to follow up with me in 4-6 weeks or sooner if needed.  We can discuss potential referral to Sports Medicine for OMM at that time if you'd like.  Buspirone tablets What is this medicine? BUSPIRONE (byoo SPYE rone) is used to treat anxiety disorders. This medicine may be used for other purposes; ask your health care provider or pharmacist if you have questions. What should I tell my health care provider before I take this medicine? They need to know if you have any of these conditions: -kidney or liver disease -an unusual or allergic reaction to buspirone, other medicines, foods, dyes, or preservatives -pregnant or trying to get pregnant -breast-feeding How should I use this medicine? Take this medicine by mouth with a glass of water. Follow the directions on the prescription label. You may take this medicine with or without food. To ensure that this medicine always works the same way for you, you should take it either always with or always without food. Take your doses at regular intervals. Do not take your medicine more often than directed. Do not stop taking except on the advice of your doctor or health care professional. Talk to your pediatrician regarding the use of this medicine in children. Special care may be needed. Overdosage: If you think you have taken too much of this medicine contact a poison control center or emergency room at once. NOTE: This medicine is only for you. Do not share this medicine with others. What if I miss a dose? If you miss a dose, take it as soon as you can. If it is almost time for your next dose, take only that dose. Do not take double or extra doses. What may interact with this  medicine? Do not take this medicine with any of the following medications: -linezolid -MAOIs like Carbex, Eldepryl, Marplan, Nardil, and Parnate -methylene blue -procarbazine This medicine may also interact with the following medications: -diazepam -digoxin -diltiazem -erythromycin -grapefruit juice -haloperidol -medicines for mental depression or mood problems -medicines for seizures like carbamazepine, phenobarbital and phenytoin -nefazodone -other medications for anxiety -rifampin -ritonavir -some antifungal medicines like itraconazole, ketoconazole, and voriconazole -verapamil -warfarin This list may not describe all possible interactions. Give your health care provider a list of all the medicines, herbs, non-prescription drugs, or dietary supplements you use. Also tell them if you smoke, drink alcohol, or use illegal drugs. Some items may interact with your medicine. What should I watch for while using this medicine? Visit your doctor or health care professional for regular checks on your progress. It may take 1 to 2 weeks before your anxiety gets better. You may get drowsy or dizzy. Do not drive, use machinery, or do anything that needs mental alertness until you know how this drug affects you. Do not stand or sit up quickly, especially if you are an older patient. This reduces the risk of dizzy or fainting spells. Alcohol can make you more drowsy and dizzy. Avoid alcoholic drinks. What side effects may I notice from receiving this medicine? Side effects that you should report to your doctor or health care professional as soon as possible: -blurred vision or other vision changes -chest pain -confusion -difficulty breathing -feelings of hostility  or anger -muscle aches and pains -numbness or tingling in hands or feet -ringing in the ears -skin rash and itching -vomiting -weakness Side effects that usually do not require medical attention (report to your doctor or health  care professional if they continue or are bothersome): -disturbed dreams, nightmares -headache -nausea -restlessness or nervousness -sore throat and nasal congestion -stomach upset This list may not describe all possible side effects. Call your doctor for medical advice about side effects. You may report side effects to FDA at 1-800-FDA-1088. Where should I keep my medicine? Keep out of the reach of children. Store at room temperature below 30 degrees C (86 degrees F). Protect from light. Keep container tightly closed. Throw away any unused medicine after the expiration date. NOTE: This sheet is a summary. It may not cover all possible information. If you have questions about this medicine, talk to your doctor, pharmacist, or health care provider.    2016, Elsevier/Gold Standard. (2009-08-19 18:06:11)

## 2014-12-30 ENCOUNTER — Encounter: Payer: Self-pay | Admitting: Student

## 2014-12-30 ENCOUNTER — Ambulatory Visit (INDEPENDENT_AMBULATORY_CARE_PROVIDER_SITE_OTHER): Payer: Self-pay | Admitting: Student

## 2014-12-30 VITALS — BP 107/62 | HR 65 | Temp 98.3°F | Ht 69.0 in | Wt 198.0 lb

## 2014-12-30 DIAGNOSIS — R002 Palpitations: Secondary | ICD-10-CM

## 2014-12-30 DIAGNOSIS — R109 Unspecified abdominal pain: Secondary | ICD-10-CM | POA: Insufficient documentation

## 2014-12-30 DIAGNOSIS — N939 Abnormal uterine and vaginal bleeding, unspecified: Secondary | ICD-10-CM | POA: Insufficient documentation

## 2014-12-30 LAB — POCT URINE PREGNANCY: PREG TEST UR: NEGATIVE

## 2014-12-30 NOTE — Progress Notes (Signed)
   Subjective:    Patient ID: Elizabeth Hines, female    DOB: Elizabeth Hines, 29 y.o.   MRN: [id number]  HPI Vaginal bleeding: noted an episode of blood spotting two days ago. She notice light blood spot on her underwear. It has resolved now. Never had mid-cycle spotting before. Denies clot or tissue. She had a cramping at the same time that lasted a couple of seconds. No trauma or fall. No fever. Uses condom for contraception but not consistent. Wondering if she could be pregnant. No vaginal discharge. No dysuria. No flank pain. Last sexual intercourse 12/16/2014. She didn't use condom at that time. LMP was 12/05/2014. Cycle every 30 to 40 days. It last 4- 5 days. Flow is heavy on 2nd to 3rd days (2 soaked tampoons).   Abdominal cramp: this happened to her two days ago. It lasted only two seconds and never came back.  Palpitation: reports daily palpitation for two years. She has history of anxiety. She is on Buspar for two years since 2013. She stopped and resumed last month. Buspar is helping. She says it is making her a little bit calmer. Her last Hgb is 11.6. Her TSH and T4 had been normal in the past.  Review of Systems Per HPI    Objective:   Physical Exam  Filed Vitals:   12/30/14 0902  BP: 107/62  Pulse: 65  Temp: 98.3 F (36.8 C)  TempSrc: Oral  Height: 5\' 9"  (1.753 m)  Weight: 198 lb (89.812 kg)   Gen: appears well Eyes: pupils equal, round and reactive to light CV: regular rate and rythm. S1 & S2 audible, no murmurs. Abd: bowel sounds normal, soft, no tenderness to palpation, no rebound or guarding, no suprapubic tenderness Pelvic exam: external genitalia appears normal, no noticeable lesion, no discharge or blood, no cervical motion tenderness.  Speculum exam: with no abnormal discharge, no vaginal wall or cervical lesion, os closed, no bleeding    Assessment & Plan:  Vaginal bleeding Mid-cycle spotting. It has resolved. Urine preg test negative today. Given her last  intercourse is two weeks ago, urine preg test is sufficient to exclude pregnancy. Had history of ASCUS 4 years ago. However, she has two normal pap smears after that. She also denies postcoital bleeding. Exams are reassuring.  -Gave return precaution. -Recommended reliable birth control if not considering pregnancy. Gave handout as well.  Abdominal cramping Not sure what to make out of sudden transient cramping that lasted only two seconds. It is only one episode.  Palpitation Likely from her anxiety. She reports this better since she started Buspar. She could benefit from SSRI for long term management. However, I will defer this to PCP

## 2014-12-30 NOTE — Assessment & Plan Note (Signed)
Not sure what to make out of sudden transient cramping that lasted only two seconds. It is only one episode.

## 2014-12-30 NOTE — Assessment & Plan Note (Signed)
Mid-cycle spotting. It has resolved. Urine preg test negative today. Given her last intercourse is two weeks ago, urine preg test is sufficient to exclude pregnancy. Had history of ASCUS 4 years ago. However, she has two normal pap smears after that. She also denies postcoital bleeding. Exams are reassuring.  -Gave return precaution. -Recommended reliable birth control if not considering pregnancy. Gave handout as well.

## 2014-12-30 NOTE — Patient Instructions (Signed)
It was great seeing you today! We have addressed the following issues today  1. Vaginal bleeding and abdominal cramping: you urine pregnancy test is negative. However, this test won't pick early pregnancy if you had unprotected sex within the last 10 days. Your exam was also normal. Mid-cycle spotting can happen in about 5% of women. Things to watch are heavy bleeding, clot or severe abdominal pain. If you experience these symptoms please return to clinic as soon as possible. 2. Contraception: I recommend using reliable contraceptive method unless you are planning to get pregnant. Please, read the handout about the available options. I also recommend watching the video on contraception. You can find the video on goggle by typing LARC first.    If we did any lab work today, and the results require attention, either me or my nurse will get in touch with you. If everything is normal, you will get a letter in mail. If you don't hear from us in two weeks, please give us a call. Otherwise, I look forward to talking with you again at our next visit. If you have any questions or concerns before then, please call the clinic at [phone].  Please bring all your medications to every doctors visit   Sign up for My Chart to have easy access to your labs results, and communication with your Primary care physician.    Please check-out at the front desk before leaving the clinic.   Take Care,

## 2014-12-30 NOTE — Assessment & Plan Note (Signed)
Likely from her anxiety. She reports this better since she started Buspar. She could benefit from SSRI for long term management. However, I will defer this to PCP

## 2015-01-22 ENCOUNTER — Ambulatory Visit (INDEPENDENT_AMBULATORY_CARE_PROVIDER_SITE_OTHER): Payer: Self-pay | Admitting: Family Medicine

## 2015-01-22 ENCOUNTER — Encounter: Payer: Self-pay | Admitting: Family Medicine

## 2015-01-22 VITALS — BP 124/59 | HR 58 | Temp 98.3°F | Ht 69.0 in | Wt 198.0 lb

## 2015-01-22 DIAGNOSIS — R002 Palpitations: Secondary | ICD-10-CM

## 2015-01-22 DIAGNOSIS — F419 Anxiety disorder, unspecified: Secondary | ICD-10-CM

## 2015-01-22 DIAGNOSIS — N939 Abnormal uterine and vaginal bleeding, unspecified: Secondary | ICD-10-CM

## 2015-01-22 DIAGNOSIS — E663 Overweight: Secondary | ICD-10-CM

## 2015-01-22 LAB — BASIC METABOLIC PANEL WITH GFR
BUN: 12 mg/dL (ref 7–25)
CALCIUM: 8.7 mg/dL (ref 8.6–10.2)
CO2: 21 mmol/L (ref 20–31)
Chloride: 106 mmol/L (ref 98–110)
Creat: 0.62 mg/dL (ref 0.50–1.10)
Glucose, Bld: 92 mg/dL (ref 65–99)
Potassium: 4.1 mmol/L (ref 3.5–5.3)
Sodium: 136 mmol/L (ref 135–146)

## 2015-01-22 LAB — TSH: TSH: 1.143 u[IU]/mL (ref 0.350–4.500)

## 2015-01-22 MED ORDER — BUSPIRONE HCL 10 MG PO TABS: 10.0000 mg | ORAL_TABLET | Freq: Two times a day (BID) | ORAL | Status: DC

## 2015-01-22 NOTE — Progress Notes (Signed)
    Subjective: CC: anxiety HPI: Elizabeth Hines is a 29 y.o. female presenting to clinic today for follow up visit. Concerns today include:  1. Anxiety Patient feels ok on 7.5mg  of Buspar.  Thinks that she would benefit from 10mg  though.  She reports that she continues to have intermittent palpitations.  She notes that palpitations seem to be well relieved by burping.  She notes that she does have occasional heartburn.  She is intolerant of spicy foods. She is reluctant to start an SSRI but feels agreeable if Buspar 10mg  not sufficient.  No shortness of breath, SI/HI.  2. Vaginal Spotting Has resolved.  Patient's last menstrual period was 01/07/2015.  Not on OCPs.  She is sexually active.  She is using condoms for contraception.  Currently, not interested in pregnancy or oral contraception.   Social History Reviewed: non smoker. FamHx and MedHx reviewed.  Please see EMR. Health Maintenance: declines flu vaccine  ROS: Per HPI  Objective: Office vital signs reviewed. BP 124/59 mmHg  Pulse 58  Temp(Src) 98.3 F (36.8 C) (Oral)  Ht 5\' 9"  (1.753 m)  Wt 198 lb (89.812 kg)  BMI 29.23 kg/m2  LMP 01/07/2015  Physical Examination:  General: Awake, alert, well nourished, No acute distress HEENT: Normal, MMM Cardio: regular rate and rhythm, S1S2 heard, no murmurs appreciated Pulm: clear to auscultation bilaterally, no wheezes, rhonchi or rales, normal work of breathing MSK: Normal gait and station Psych: mood stable, speech normal, good eye contact GAD-7 : 14  Assessment/ Plan: 29 y.o. female   1. Chronic anxiety.  GAD-7 slightly improved compared to last time.  Continues to have break through anxiety.  She also continues to have intermittent palpitations.  Though, unclear if actually GI in nature.  Seems to be relieved by antacids.  We discussed using PeptoBismol when she has episodes, as this seems to relieve them in the past.  If needing >3 times per week, patient to return and we will  discuss starting on Zantac. - busPIRone (BUSPAR) 10 MG tablet; Take 1 tablet (10 mg total) by mouth 2 (two) times daily.  Dispense: 60 tablet; Refill: 5 - Follow up next month for annual exam  2. Palpitation. ? GI in nature. - busPIRone (BUSPAR) 10 MG tablet; Take 1 tablet (10 mg total) by mouth 2 (two) times daily.  Dispense: 60 tablet; Refill: 5 - TSH - BASIC METABOLIC PANEL WITH GFR - CBC - Return precautions reviewed.  3. Overweight (BMI 25.0-29.9) - TSH - BASIC METABOLIC PANEL WITH GFR  4. Vaginal bleeding - Resolved.  Continue to follow - Patient to consider contraception, though seems resistant to this currently, even though she is not interested in becoming pregnant   Raliegh IpAshly M Emony Dormer, DO PGY-2, [facility]

## 2015-01-22 NOTE — Patient Instructions (Addendum)
I have increased your Buspar to 10 mg.  Plan to see me again in January for you annual physical with pap smear.  Generalized Anxiety Disorder Generalized anxiety disorder (GAD) is a mental disorder. It interferes with life functions, including relationships, work, and school. GAD is different from normal anxiety, which everyone experiences at some point in their lives in response to specific life events and activities. Normal anxiety actually helps us prepare for and get through these life events and activities. Normal anxiety goes away after the event or activity is over.  GAD causes anxiety that is not necessarily related to specific events or activities. It also causes excess anxiety in proportion to specific events or activities. The anxiety associated with GAD is also difficult to control. GAD can vary from mild to severe. People with severe GAD can have intense waves of anxiety with physical symptoms (panic attacks).  SYMPTOMS The anxiety and worry associated with GAD are difficult to control. This anxiety and worry are related to many life events and activities and also occur more days than not for 6 months or longer. People with GAD also have three or more of the following symptoms (one or more in children):  Restlessness.   Fatigue.  Difficulty concentrating.   Irritability.  Muscle tension.  Difficulty sleeping or unsatisfying sleep. DIAGNOSIS GAD is diagnosed through an assessment by your health care provider. Your health care provider will ask you questions aboutyour mood,physical symptoms, and events in your life. Your health care provider may ask you about your medical history and use of alcohol or drugs, including prescription medicines. Your health care provider may also do a physical exam and blood tests. Certain medical conditions and the use of certain substances can cause symptoms similar to those associated with GAD. Your health care provider may refer you to a mental  health specialist for further evaluation. TREATMENT The following therapies are usually used to treat GAD:   Medication. Antidepressant medication usually is prescribed for long-term daily control. Antianxiety medicines may be added in severe cases, especially when panic attacks occur.   Talk therapy (psychotherapy). Certain types of talk therapy can be helpful in treating GAD by providing support, education, and guidance. A form of talk therapy called cognitive behavioral therapy can teach you healthy ways to think about and react to daily life events and activities.  Stress managementtechniques. These include yoga, meditation, and exercise and can be very helpful when they are practiced regularly. A mental health specialist can help determine which treatment is best for you. Some people see improvement with one therapy. However, other people require a combination of therapies.   This information is not intended to replace advice given to you by your health care provider. Make sure you discuss any questions you have with your health care provider.   Document Released: 05/06/2012 Document Revised: 01/30/2014 Document Reviewed: 05/06/2012 Elsevier Interactive Patient Education [employer]2016 Elsevier Inc.

## 2015-01-23 LAB — CBC
HCT: 31.7 % — ABNORMAL LOW (ref 36.0–46.0)
Hemoglobin: 9.7 g/dL — ABNORMAL LOW (ref 12.0–15.0)
MCH: 21.8 pg — ABNORMAL LOW (ref 26.0–34.0)
MCHC: 30.6 g/dL (ref 30.0–36.0)
MCV: 71.2 fL — ABNORMAL LOW (ref 78.0–100.0)
MPV: 10.4 fL (ref 8.6–12.4)
Platelets: 243 10*3/uL (ref 150–400)
RBC: 4.45 MIL/uL (ref 3.87–5.11)
RDW: 16.8 % — ABNORMAL HIGH (ref 11.5–15.5)
WBC: 4.6 10*3/uL (ref 4.0–10.5)

## 2015-01-24 ENCOUNTER — Telehealth: Payer: Self-pay | Admitting: Family Medicine

## 2015-01-24 ENCOUNTER — Encounter: Payer: Self-pay | Admitting: Family Medicine

## 2015-01-24 DIAGNOSIS — D509 Iron deficiency anemia, unspecified: Secondary | ICD-10-CM

## 2015-01-24 MED ORDER — FERROUS SULFATE 324 (65 FE) MG PO TBEC: 324.0000 mg | DELAYED_RELEASE_TABLET | Freq: Two times a day (BID) | ORAL | Status: DC

## 2015-01-24 NOTE — Telephone Encounter (Signed)
Called to inform patient of labs.  Hgb low.  Would like to initiate by mouth therapy and recheck in about 1 month.  Will plan for POCT at that time.  No answer, so will send letter with results.  Plan for FeSo4 TWICE A DAY.  Trany Chernick M. Nadine CountsGottschalk, DO PGY-2, [facility]

## 2015-02-10 ENCOUNTER — Telehealth: Payer: Self-pay | Admitting: Family Medicine

## 2015-02-10 ENCOUNTER — Other Ambulatory Visit: Payer: Self-pay | Admitting: Family Medicine

## 2015-02-10 DIAGNOSIS — D509 Iron deficiency anemia, unspecified: Secondary | ICD-10-CM

## 2015-02-10 MED ORDER — POLYSACCHARIDE IRON COMPLEX 150 MG PO CAPS: 150.0000 mg | ORAL_CAPSULE | Freq: Every day | ORAL | Status: DC

## 2015-02-10 NOTE — Telephone Encounter (Signed)
Ferrex  sent into pharmacy.  I'm not sure that iron will be covered for patient.

## 2015-02-10 NOTE — Telephone Encounter (Signed)
Pt calling to inform PCP that the iron supplement prescribed is not covered by her insurance and to pay out of pocket it would be too costly. She would like to know if we could prescribe something else that the insurance could cover or to be advised whether or not there is an OTC medicine that she could take in place of it. Sadie Reynolds, ASA

## 2015-02-12 ENCOUNTER — Telehealth: Payer: Self-pay

## 2015-02-12 NOTE — Telephone Encounter (Signed)
LVM for pt to call back to inform her of below. Zimmerman Rumple, April D, CMA  

## 2015-02-15 NOTE — Telephone Encounter (Signed)
Wants to know what iron supplement she can take over the counter?

## 2015-02-16 NOTE — Telephone Encounter (Signed)
Yes she can get Ferrous Sulfate  and take twice daily.  Please let her know.

## 2015-02-16 NOTE — Telephone Encounter (Signed)
Pt informed. Elizabeth Hines, CMA  

## 2015-02-17 ENCOUNTER — Telehealth: Payer: Self-pay | Admitting: Family Medicine

## 2015-02-17 NOTE — Telephone Encounter (Signed)
Pt would like to know if there is a reaction between Buspar and Iron supplement. Please advise at earliest convenience. Elizabeth Hines, ASA

## 2015-02-17 NOTE — Telephone Encounter (Signed)
NO.  Please advise patient.

## 2015-02-17 NOTE — Telephone Encounter (Signed)
Will forward to MD to advise. Mishell Donalson,CMA  

## 2015-02-17 NOTE — Telephone Encounter (Signed)
Patient is aware of this. Jazmin Hartsell,CMA  

## 2015-02-22 ENCOUNTER — Encounter: Payer: Self-pay | Admitting: Student

## 2015-02-22 ENCOUNTER — Encounter: Payer: Medicaid Other | Admitting: Family Medicine

## 2015-02-22 ENCOUNTER — Ambulatory Visit (INDEPENDENT_AMBULATORY_CARE_PROVIDER_SITE_OTHER): Payer: Self-pay | Admitting: Student

## 2015-02-22 VITALS — BP 116/70 | HR 61 | Temp 98.4°F | Wt 198.0 lb

## 2015-02-22 DIAGNOSIS — E669 Obesity, unspecified: Secondary | ICD-10-CM

## 2015-02-22 DIAGNOSIS — E663 Overweight: Secondary | ICD-10-CM

## 2015-02-22 LAB — LIPID PANEL
CHOL/HDL RATIO: 2 ratio (ref <=5.0)
CHOLESTEROL: 139 mg/dL (ref 125–200)
HDL: 71 mg/dL (ref >=46)
LDL Cholesterol: 59 mg/dL (ref <130)
Triglycerides: 47 mg/dL (ref <150)
VLDL: 9 mg/dL (ref <30)

## 2015-02-22 MED ORDER — FLUTICASONE PROPIONATE 50 MCG/ACT NA SUSP: 2.0000 | Freq: Every day | NASAL | Status: DC

## 2015-02-22 NOTE — Patient Instructions (Signed)
Please follow-up with PCP as needed You were prescribed fluticasone for allergic rhinitis If you have further questions or concerns calling office at [phone]

## 2015-02-22 NOTE — Assessment & Plan Note (Signed)
Given impairment with quality of life with nasal dryness associated with allergic rhinitis, will start fluticasone nasal spray

## 2015-02-22 NOTE — Progress Notes (Signed)
   Subjective:    Patient ID: Elizabeth Hines, female    DOB: 05-10-1985, 30 y.o.   MRN: [id number]   CC:  Physical exam, allergic rhinitis  HPI: 30 year old female presenting for physical exam and concern for allergic rhinitis  Allergic rhinitis - Currently managed with cetirizine. - She has had several nosebleeds which she feels are due to her allergic rhinitis. - Her last visit weight was last night. She noted a small amount of blood on tissue after blowing her nose. - She would like to try another medicine in addition to cetirizine for her allergic rhinitis - Denies cough, sore throat  Review of Systems ROS - Per history of present illness, otherwise she denies chest pain, shortness of breath, fevers, nausea, vomiting, diarrhea Past Medical, Surgical, Social, and Family History Reviewed & Updated per EMR.   Objective:  BP 116/70 mmHg  Pulse 61  Temp(Src) 98.4 F (36.9 C) (Oral)  Wt 198 lb (89.812 kg)  LMP 02/12/2015 (Exact Date) Vitals and nursing note reviewed  General: NAD Cardiac: RRR,  Respiratory: CTAB, normal effort Abdomen: soft, nontender, nondistended Skin: warm and dry, no rashes noted Neuro: alert and oriented, no focal deficits   Assessment & Plan:    Allergic rhinitis Given impairment with quality of life with nasal dryness associated with allergic rhinitis, will start fluticasone nasal spray  Overweight (BMI 25.0-29.9) Fasting lipid panel today per patient request. She has only had water today - Will follow up     Praise Dolecki A. Kennon Rounds MD, MS Family Medicine Resident PGY-2 Pager [phone]

## 2015-02-22 NOTE — Assessment & Plan Note (Signed)
Fasting lipid panel today per patient request. She has only had water today - Will follow up

## 2015-12-13 ENCOUNTER — Ambulatory Visit: Payer: Medicaid Other | Admitting: Family Medicine

## 2016-05-08 ENCOUNTER — Encounter: Payer: Medicaid Other | Admitting: Family Medicine

## 2016-10-16 ENCOUNTER — Encounter: Payer: Self-pay | Admitting: Family Medicine

## 2016-10-16 ENCOUNTER — Ambulatory Visit (INDEPENDENT_AMBULATORY_CARE_PROVIDER_SITE_OTHER): Payer: Self-pay | Admitting: Family Medicine

## 2016-10-16 VITALS — BP 100/78 | HR 96 | Temp 99.0°F | Wt 209.0 lb

## 2016-10-16 DIAGNOSIS — Z113 Encounter for screening for infections with a predominantly sexual mode of transmission: Secondary | ICD-10-CM

## 2016-10-16 DIAGNOSIS — H189 Unspecified disorder of cornea: Secondary | ICD-10-CM

## 2016-10-16 DIAGNOSIS — N898 Other specified noninflammatory disorders of vagina: Secondary | ICD-10-CM

## 2016-10-16 MED ORDER — METRONIDAZOLE 500 MG PO TABS: 500.0000 mg | ORAL_TABLET | Freq: Two times a day (BID) | ORAL | 0 refills | Status: AC

## 2016-10-16 NOTE — Patient Instructions (Signed)
   It was great seeing you today!   Please take Flagyl for 7 days to treat BV.  You will hear from our referral department to schedule an appointment with an eye doctor.  If you have questions or concerns please do not hesitate to call at [phone].  Dolores Patty, DO PGY-2, [facility] 10/16/2016 3:15 PM   Bacterial Vaginosis Bacterial vaginosis is an infection of the vagina. It happens when too many germs (bacteria) grow in the vagina. This infection puts you at risk for infections from sex (STIs). Treating this infection can lower your risk for some STIs. You should also treat this if you are pregnant. It can cause your baby to be born early. Follow these instructions at home: Medicines  Take over-the-counter and prescription medicines only as told by your doctor.  Take or use your antibiotic medicine as told by your doctor. Do not stop taking or using it even if you start to feel better. General instructions  If you your sexual partner is a woman, tell her that you have this infection. She needs to get treatment if she has symptoms. If you have a female partner, he does not need to be treated.  During treatment: ? Avoid sex. ? Do not douche. ? Avoid alcohol as told. ? Avoid breastfeeding as told.  Drink enough fluid to keep your pee (urine) clear or pale yellow.  Keep your vagina and butt (rectum) clean. ? Wash the area with warm water every day. ? Wipe from front to back after you use the toilet.  Keep all follow-up visits as told by your doctor. This is important. Preventing this condition  Do not douche.  Use only warm water to wash around your vagina.  Use protection when you have sex. This includes: ? Latex condoms. ? Dental dams.  Limit how many people you have sex with. It is best to only have sex with the same person (be monogamous).  Get tested for STIs. Have your partner get tested.  Wear underwear that is cotton or lined with  cotton.  Avoid tight pants and pantyhose. This is most important in summer.  Do not use any products that have nicotine or tobacco in them. These include cigarettes and e-cigarettes. If you need help quitting, ask your doctor.  Do not use illegal drugs.  Limit how much alcohol you drink. Contact a doctor if:  Your symptoms do not get better, even after you are treated.  You have more discharge or pain when you pee (urinate).  You have a fever.  You have pain in your belly (abdomen).  You have pain with sex.  Your bleed from your vagina between periods. Summary  This infection happens when too many germs (bacteria) grow in the vagina.  Treating this condition can lower your risk for some infections from sex (STIs).  You should also treat this if you are pregnant. It can cause early (premature) birth.  Do not stop taking or using your antibiotic medicine even if you start to feel better. This information is not intended to replace advice given to you by your health care provider. Make sure you discuss any questions you have with your health care provider. Document Released: 10/19/2007 Document Revised: 09/25/2015 Document Reviewed: 09/25/2015 Elsevier Interactive Patient Education  2017 [employer].

## 2016-10-16 NOTE — Progress Notes (Signed)
    Subjective:    Patient ID: Elizabeth Hines, female    DOB: [birth year], 31 y.o.   MRN: [id number]   CC: patient presents with left eye swelling and vaginal discharge  Left eye swelling Reports eye has been swollen and red x 1 day. She denies discharge from the eye. She was looking at her eye yesterday in mirror and noticed a white spot in the brown part of her eye. Has never noticed it there before. Denies eye pain. Denies difficulty seeing or blurred vision, no decreased visual acuity.    Vaginal Discharge Present for past 4-5 days. Used vagistat Friday without relief. Reports white discharge. Has itching as well. Denies dysuria, frequency, urgency. Sexually active with 1 female partner. No fevers, chills, no pelvic pain, no vaginal bleeding   Smoking status reviewed- non-smoker  Review of Systems- see HPI   Objective:  BP 100/78   Pulse 96   Temp 99 F (37.2 C) (Oral)   Wt 209 lb (94.8 kg)   LMP 09/13/2016   SpO2 99%   BMI 30.86 kg/m  Vitals and nursing note reviewed  General: well nourished, in no acute distress Eyes: PERRLA, EOMI. Left eye with conjunctival injection. Pinpoint white spot noted on cornea over iris. Does not uptake fluorescein when stained. Right eye normal in appearance. Cardiac: RRR, clear S1 and S2, no murmurs, rubs, or gallops Respiratory: clear to auscultation bilaterally, no increased work of breathing GU: Normal female external genitalia. Moderate amount of malodorous thin white discharge. Cervix pink, without lesions.  Extremities: no edema or cyanosis. Skin: warm and dry, no rashes noted Neuro: alert and oriented, no focal deficits   Assessment & Plan:    Corneal abnormality  Eye red likely secondary to allergic or viral conjunctivitis, however there is an abnormality on the cornea over the iris that does not stain with fluorescein. Unclear what this is from.  -rx given for erythro eye ointment to use QID until patient can be seen by  ophthalmology -referral made urgently to optho for evaluation -follow up if symptoms worsen or vision becomes impaired  Vaginal discharge  Wet prep consistent with BV  -rx given for flagyl x 1 week to treat for BV -gc/chlamydia pending -follow up as needed     Return if symptoms worsen or fail to improve.   Dolores Patty, DO Family Medicine Resident PGY-2

## 2016-10-17 ENCOUNTER — Encounter: Payer: Self-pay | Admitting: Family Medicine

## 2016-10-17 ENCOUNTER — Other Ambulatory Visit: Payer: Self-pay | Admitting: Family Medicine

## 2016-10-17 ENCOUNTER — Telehealth: Payer: Self-pay | Admitting: *Deleted

## 2016-10-17 DIAGNOSIS — N898 Other specified noninflammatory disorders of vagina: Secondary | ICD-10-CM | POA: Insufficient documentation

## 2016-10-17 DIAGNOSIS — H189 Unspecified disorder of cornea: Secondary | ICD-10-CM | POA: Insufficient documentation

## 2016-10-17 MED ORDER — ERYTHROMYCIN 5 MG/GM OP OINT: 1.0000 "application " | TOPICAL_OINTMENT | Freq: Four times a day (QID) | OPHTHALMIC | 0 refills | Status: DC

## 2016-10-17 NOTE — Assessment & Plan Note (Signed)
  Eye red likely secondary to allergic or viral conjunctivitis, however there is an abnormality on the cornea over the iris that does not stain with fluorescein. Unclear what this is from.  -rx given for erythro eye ointment to use QID until patient can be seen by ophthalmology -referral made urgently to optho for evaluation -follow up if symptoms worsen or vision becomes impaired

## 2016-10-17 NOTE — Assessment & Plan Note (Signed)
  Wet prep consistent with BV  -rx given for flagyl x 1 week to treat for BV -gc/chlamydia pending -follow up as needed

## 2016-10-17 NOTE — Telephone Encounter (Signed)
Pt calls nurse line and left message.  She would like to speak with Provider and inquire why she was not given an abx eye drop until she "see an eye doctor" Fleeger, [name], CMA

## 2016-10-17 NOTE — Telephone Encounter (Signed)
Returned call to patient and left message that rx was sent in for antibiotic eye ointment to use QID.

## 2016-10-19 LAB — POCT WET PREP (WET MOUNT)
CLUE CELLS WET PREP WHIFF POC: POSITIVE
Trichomonas Wet Prep HPF POC: ABSENT

## 2016-11-03 ENCOUNTER — Ambulatory Visit (HOSPITAL_COMMUNITY)
Admission: RE | Admit: 2016-11-03 | Discharge: 2016-11-03 | Disposition: A | Discharge status: Home / Self Care | Payer: Medicaid Other | Source: Ambulatory Visit | Attending: Family Medicine | Admitting: Family Medicine

## 2016-11-03 ENCOUNTER — Ambulatory Visit (INDEPENDENT_AMBULATORY_CARE_PROVIDER_SITE_OTHER): Payer: Self-pay | Admitting: Family Medicine

## 2016-11-03 ENCOUNTER — Encounter: Payer: Self-pay | Admitting: Family Medicine

## 2016-11-03 VITALS — BP 114/78 | HR 97 | Temp 98.0°F | Wt 209.8 lb

## 2016-11-03 DIAGNOSIS — D508 Other iron deficiency anemias: Secondary | ICD-10-CM

## 2016-11-03 DIAGNOSIS — R42 Dizziness and giddiness: Secondary | ICD-10-CM | POA: Insufficient documentation

## 2016-11-03 LAB — POCT HEMOGLOBIN: Hemoglobin: 9.6 g/dL — AB (ref 12.2–16.2)

## 2016-11-03 NOTE — Patient Instructions (Addendum)
You were seen in clinic for symptoms of lightheadedness and we checked your hemoglobin level which was 9.6.  You also had an EKG which was normal.   I will follow up with you once I have the results of the additional labs.  Given your history of a higher hemoglobin in the past, it is likely that you will need to begin taking iron supplementation again.  If this is needed, I will call it in for you to your pharmacy.  Please call clinic with any questions.   Be well, Freddrick March, MD

## 2016-11-03 NOTE — Progress Notes (Signed)
   Subjective:   Patient ID: Elizabeth Hines    DOB: 1985-06-12, 31 y.o. female   MRN: [id number]  CC: lightheadedness   HPI: Elizabeth Hines is a 31 y.o. female who presents to clinic today for lightheadedness.   Lightheadness Occurred one time, never has experienced this before. Reports she was working on Wednesday and was standing up while doing some paperwork.  She felt "woozy" and ate something.  Works in a [employer].  Felt a little better after eating but the feeling shortly returned.   She denies dizziness, did not fall or faint.  Reports she just got over a head cold last week.  Has been eating and drinking normally.  Has had a good appetite despite the cold.   Denies sensation of room spinning when she closes her eyes.  She was taking iron pills a few years ago for anemia at which time she was experiencing similar symptoms.  Is no longer taking iron supplementation.  At that time she was told her hemoglobin and iron level was low.  Denies shortness of breath, leg swelling, chest pain, palpitations.  Denies thyroid issues.  She is not a diabetic and has no history of seizures.  Has been feeling tired more often but she wakes up early for work so feels this may be the reason.  Has been chewing ice a lot more at work- she is concerned she may have some sort of deficiency because of this.   ROS: Denies fevers, chills, nausea, vomiting, diarrhea.  No abdominal pain or leg swelling.  Denies SOB, CP, palpitations.  PMFSH: Pertinent past medical, surgical, family, and social history were reviewed and updated as appropriate. Smoking status reviewed. Social: patient is a never smoker.  Medications reviewed.  Objective:   BP 114/78 (BP Location: Left Arm, Patient Position: Sitting, Cuff Size: Large)   Pulse 97   Temp 98 F (36.7 C) (Oral)   Wt 209 lb 12.8 oz (95.2 kg)   LMP 10/18/2016 (Exact Date)   SpO2 98%   BMI 30.98 kg/m  Vitals and nursing note reviewed.  General: 31 yo female, NAD  HEENT:  NCAT, MMM, o/p clear, conjunctival pallor noted  CV: RRR no MRG  Lungs: CTAB, non-laboured Abdomen: soft,  Skin: warm, dry, no rashes or lesions, cap refill < 2 seconds  Extremities: warm and well perfused, normal tone  Assessment & Plan:   Lightheadedness Isolated incident.  Likely related to anemia given history.  Normal EKG in clinic. No longer taking iron supplementation and Hgb in 2016 stable at 9.7.  Point of care Hgb check today 9.6 with previous baseline of 12.  No active bleeding noted and physical exam without red flags.  -Will check ferritin and CBC today -if related to iron deficiency anemia will write rx for supplementation   Orders Placed This Encounter  Procedures  . CBC with Differential/Platelet  . Ferritin  . POCT hemoglobin  . EKG 12-Lead   Follow up: 2 months or sooner if needed   Freddrick March, MD [facility], PGY-2 11/06/2016 3:55 PM

## 2016-11-05 LAB — CBC WITH DIFFERENTIAL/PLATELET
BASOS ABS: 0 10*3/uL (ref 0.0–0.2)
Basos: 0 %
EOS (ABSOLUTE): 0.1 10*3/uL (ref 0.0–0.4)
Eos: 1 %
HEMATOCRIT: 30.2 % — AB (ref 34.0–46.6)
Hemoglobin: 8.7 g/dL — ABNORMAL LOW (ref 11.1–15.9)
IMMATURE GRANS (ABS): 0 10*3/uL (ref 0.0–0.1)
IMMATURE GRANULOCYTES: 0 %
Lymphocytes Absolute: 2.2 10*3/uL (ref 0.7–3.1)
Lymphs: 34 %
MCH: 20.2 pg — ABNORMAL LOW (ref 26.6–33.0)
MCHC: 28.8 g/dL — ABNORMAL LOW (ref 31.5–35.7)
MCV: 70 fL — AB (ref 79–97)
MONOCYTES: 8 %
Monocytes Absolute: 0.5 10*3/uL (ref 0.1–0.9)
NEUTROS ABS: 3.7 10*3/uL (ref 1.4–7.0)
Neutrophils: 57 %
PLATELETS: 255 10*3/uL (ref 150–379)
RBC: 4.31 x10E6/uL (ref 3.77–5.28)
RDW: 17.6 % — ABNORMAL HIGH (ref 12.3–15.4)
WBC: 6.6 10*3/uL (ref 3.4–10.8)

## 2016-11-05 LAB — FERRITIN: Ferritin: 5 ng/mL — ABNORMAL LOW (ref 15–150)

## 2016-11-06 DIAGNOSIS — R42 Dizziness and giddiness: Secondary | ICD-10-CM | POA: Insufficient documentation

## 2016-11-06 NOTE — Assessment & Plan Note (Addendum)
Isolated incident.  Likely related to anemia given history.  Normal EKG in clinic. No longer taking iron supplementation and Hgb in 2016 stable at 9.7.  Point of care Hgb check today 9.6 with previous baseline of 12.  No active bleeding noted and physical exam without red flags.  -Will check ferritin and CBC today -if related to iron deficiency anemia will write rx for supplementation

## 2016-11-09 ENCOUNTER — Other Ambulatory Visit: Payer: Self-pay | Admitting: Family Medicine

## 2016-11-09 ENCOUNTER — Telehealth: Payer: Self-pay | Admitting: *Deleted

## 2016-11-09 MED ORDER — FERROUS SULFATE 134 MG PO TABS: 1.0000 | ORAL_TABLET | Freq: Two times a day (BID) | ORAL | 0 refills | Status: DC

## 2016-11-09 NOTE — Telephone Encounter (Signed)
Please inform patient that her labs indicated iron deficiency anemia and I have sent in oral iron supplementation for her to take twice daily.  Thanks!

## 2016-11-09 NOTE — Telephone Encounter (Signed)
Pt saw her results on mychart.  She wants to know if she needs to start a supplement. Will forward to provider who saw her and PCP. Arcenio Mullaly, Maryjo RochesterJessica Dawn, CMA

## 2016-11-10 NOTE — Telephone Encounter (Signed)
Pt informed. Neeley Sedivy Dawn, CMA  

## 2016-11-13 NOTE — Telephone Encounter (Signed)
Patient left message on nurse line asking if it is ok to have glass of wine a few hour after taking iron supplement. Kinnie FeilL. Ducatte, RN, BSN

## 2016-11-15 NOTE — Telephone Encounter (Signed)
This is ok

## 2016-11-15 NOTE — Telephone Encounter (Signed)
Left message on VM that it is ok per PCP. Kinnie FeilL. Bartt Gonzaga, RN, BSN

## 2017-01-02 ENCOUNTER — Other Ambulatory Visit (HOSPITAL_COMMUNITY)
Admission: RE | Admit: 2017-01-02 | Discharge: 2017-01-02 | Disposition: A | Discharge status: Home / Self Care | Payer: Medicaid Other | Source: Ambulatory Visit | Attending: Family Medicine | Admitting: Family Medicine

## 2017-01-02 ENCOUNTER — Encounter: Payer: Self-pay | Admitting: Family Medicine

## 2017-01-02 ENCOUNTER — Ambulatory Visit (INDEPENDENT_AMBULATORY_CARE_PROVIDER_SITE_OTHER): Payer: Self-pay | Admitting: Family Medicine

## 2017-01-02 ENCOUNTER — Other Ambulatory Visit: Payer: Self-pay

## 2017-01-02 VITALS — BP 98/70 | HR 108 | Temp 99.1°F | Wt 206.0 lb

## 2017-01-02 DIAGNOSIS — N760 Acute vaginitis: Secondary | ICD-10-CM

## 2017-01-02 DIAGNOSIS — Z124 Encounter for screening for malignant neoplasm of cervix: Secondary | ICD-10-CM | POA: Insufficient documentation

## 2017-01-02 DIAGNOSIS — N898 Other specified noninflammatory disorders of vagina: Secondary | ICD-10-CM

## 2017-01-02 DIAGNOSIS — Z113 Encounter for screening for infections with a predominantly sexual mode of transmission: Secondary | ICD-10-CM

## 2017-01-02 DIAGNOSIS — B9689 Other specified bacterial agents as the cause of diseases classified elsewhere: Secondary | ICD-10-CM

## 2017-01-02 LAB — POCT WET PREP (WET MOUNT)
CLUE CELLS WET PREP WHIFF POC: POSITIVE
Trichomonas Wet Prep HPF POC: ABSENT

## 2017-01-02 MED ORDER — METRONIDAZOLE 500 MG PO TABS
500.0000 mg | ORAL_TABLET | Freq: Two times a day (BID) | ORAL | 0 refills | Status: DC
Start: 2017-01-02 — End: 2017-11-28

## 2017-01-02 NOTE — Patient Instructions (Signed)
Bacterial Vaginosis Bacterial vaginosis is a vaginal infection that occurs when the normal balance of bacteria in the vagina is disrupted. It results from an overgrowth of certain bacteria. This is the most common vaginal infection among women ages 15-44. Because bacterial vaginosis increases your risk for STIs (sexually transmitted infections), getting treated can help reduce your risk for chlamydia, gonorrhea, herpes, and HIV (human immunodeficiency virus). Treatment is also important for preventing complications in pregnant women, because this condition can cause an early (premature) delivery. What are the causes? This condition is caused by an increase in harmful bacteria that are normally present in small amounts in the vagina. However, the reason that the condition develops is not fully understood. What increases the risk? The following factors may make you more likely to develop this condition:  Having a new sexual partner or multiple sexual partners.  Having unprotected sex.  Douching.  Having an intrauterine device (IUD).  Smoking.  Drug and alcohol abuse.  Taking certain antibiotic medicines.  Being pregnant.  You cannot get bacterial vaginosis from toilet seats, bedding, swimming pools, or contact with objects around you. What are the signs or symptoms? Symptoms of this condition include:  Grey or white vaginal discharge. The discharge can also be watery or foamy.  A fish-like odor with discharge, especially after sexual intercourse or during menstruation.  Itching in and around the vagina.  Burning or pain with urination.  Some women with bacterial vaginosis have no signs or symptoms. How is this diagnosed? This condition is diagnosed based on:  Your medical history.  A physical exam of the vagina.  Testing a sample of vaginal fluid under a microscope to look for a large amount of bad bacteria or abnormal cells. Your health care provider may use a cotton swab  or a small wooden spatula to collect the sample.  How is this treated? This condition is treated with antibiotics. These may be given as a pill, a vaginal cream, or a medicine that is put into the vagina (suppository). If the condition comes back after treatment, a second round of antibiotics may be needed. Follow these instructions at home: Medicines  Take over-the-counter and prescription medicines only as told by your health care provider.  Take or use your antibiotic as told by your health care provider. Do not stop taking or using the antibiotic even if you start to feel better. General instructions  If you have a female sexual partner, tell her that you have a vaginal infection. She should see her health care provider and be treated if she has symptoms. If you have a female sexual partner, he does not need treatment.  During treatment: ? Avoid sexual activity until you finish treatment. ? Do not douche. ? Avoid alcohol as directed by your health care provider. ? Avoid breastfeeding as directed by your health care provider.  Drink enough water and fluids to keep your urine clear or pale yellow.  Keep the area around your vagina and rectum clean. ? Wash the area daily with warm water. ? Wipe yourself from front to back after using the toilet.  Keep all follow-up visits as told by your health care provider. This is important. How is this prevented?  Do not douche.  Wash the outside of your vagina with warm water only.  Use protection when having sex. This includes latex condoms and dental dams.  Limit how many sexual partners you have. To help prevent bacterial vaginosis, it is best to have sex with just   one partner (monogamous).  Make sure you and your sexual partner are tested for STIs.  Wear cotton or cotton-lined underwear.  Avoid wearing tight pants and pantyhose, especially during summer.  Limit the amount of alcohol that you drink.  Do not use any products that  contain nicotine or tobacco, such as cigarettes and e-cigarettes. If you need help quitting, ask your health care provider.  Do not use illegal drugs. Where to find more information:  Centers for Disease Control and Prevention: www.cdc.gov/std  American Sexual Health Association (ASHA): www.ashastd.org  U.S. Department of Health and Human Services, Office on Women's Health: www.womenshealth.gov/ or https://www.womenshealth.gov/a-z-topics/bacterial-vaginosis Contact a health care provider if:  Your symptoms do not improve, even after treatment.  You have more discharge or pain when urinating.  You have a fever.  You have pain in your abdomen.  You have pain during sex.  You have vaginal bleeding between periods. Summary  Bacterial vaginosis is a vaginal infection that occurs when the normal balance of bacteria in the vagina is disrupted.  Because bacterial vaginosis increases your risk for STIs (sexually transmitted infections), getting treated can help reduce your risk for chlamydia, gonorrhea, herpes, and HIV (human immunodeficiency virus). Treatment is also important for preventing complications in pregnant women, because the condition can cause an early (premature) delivery.  This condition is treated with antibiotic medicines. These may be given as a pill, a vaginal cream, or a medicine that is put into the vagina (suppository). This information is not intended to replace advice given to you by your health care provider. Make sure you discuss any questions you have with your health care provider. Document Released: 01/09/2005 Document Revised: 09/25/2015 Document Reviewed: 09/25/2015 Elsevier Interactive Patient Education  2017 [employer]  

## 2017-01-02 NOTE — Progress Notes (Signed)
   Subjective:    Patient ID: Elizabeth Hines , female   DOB: 1985-03-07 , 31 y.o..   MRN: [id number]  HPI  Elizabeth Hines is here for  Chief Complaint  Patient presents with  . Vaginal Discharge    1. VAGINAL DISCHARGE  Having vaginal discharge for 7days. Medications tried: nothing Discharge consistency: watery Discharge color: clear Recent antibiotic use: none Sex in last month: yes Possible STD exposure:unsure  Symptoms Fever: no Dysuria:no Vaginal bleeding: no Abdomen or Pelvic pain: no Back pain: no Genital sores or ulcers:no Rash: no Pain during sex: no Missed menstrual period: no LMP Nov 30th, has regular monthly periods  ROS see HPI Smoking Status noted  Past Medical History: Patient Active Problem List   Diagnosis Date Noted  . Lightheadedness 11/06/2016  . 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 Vaginal discharge 10/17/2016  . Vaginal bleeding 12/30/2014  . Abdominal cramping 12/30/2014  . Palpitation 10/30/2014  . Allergic rhinitis 02/02/2014  . Anemia 06/11/2012  . Healthcare maintenance 09/22/2011  . Contraception management 07/29/2011  . Overweight (BMI 25.0-29.9) 03/28/2011  . Bacterial vaginosis 11/22/2010  . 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 Chronic anxiety 10/14/2010    Medications: reviewed  Social Hx:  reports that  has never smoked. she has never used smokeless tobacco.   Objective:   BP 98/70   Pulse (!) 108   Temp 99.1 F (37.3 C) (Oral)   Wt 206 lb (93.4 kg)   LMP 12/22/2016   SpO2 98%   BMI 30.42 kg/m  Physical Exam  Gen: NAD, alert, cooperative with exam, well-appearing GYN:  External genitalia within normal limits.  Vaginal mucosa pink, moist, normal rugae.  Nonfriable cervix without lesions, no discharge or bleeding noted on speculum exam.    Results for orders placed or performed in visit on 01/02/17  POCT Wet Prep [facility]([place] [place])  Result Value Ref Range   Source Wet Prep POC VAG    WBC, Wet Prep HPF  POC 0-3    Bacteria Wet Prep HPF POC Moderate (A) Few   Clue Cells Wet Prep HPF POC Moderate (A) None   Clue Cells Wet Prep Whiff POC Positive Whiff    Yeast Wet Prep HPF POC None    Trichomonas Wet Prep HPF POC Absent Absent     Assessment & Plan:  Bacterial vaginosis Positive for BV today with clue cells on wet prep. Likely source of symptoms.  - Flagyl 500 mg BID x 7 day course - Discussed that if she continues to get BV infections, may consider starting vaginal metronidazole gel  - GC chlamydia, HIV, RPR pending  Orders Placed This Encounter  Procedures  . HIV antibody (with reflex)  . RPR  . POCT Wet Prep [facility]([place] [place])   Meds ordered this encounter  Medications  . metroNIDAZOLE (FLAGYL) 500 MG tablet    Sig: Take 1 tablet (500 mg total) by mouth 2 (two) times daily.    Dispense:  14 tablet    Refill:  0    Anders Simmondshristina Gambino, MD [facility], PGY-3

## 2017-01-03 NOTE — Assessment & Plan Note (Addendum)
Positive for BV today with clue cells on wet prep. Likely source of symptoms.  - Flagyl 500 mg BID x 7 day course - Discussed that if she continues to get BV infections, may consider starting vaginal metronidazole gel  - GC chlamydia, HIV, RPR pending

## 2017-01-05 ENCOUNTER — Encounter: Payer: Self-pay | Admitting: Family Medicine

## 2017-01-05 LAB — CYTOLOGY - PAP
Chlamydia: NEGATIVE
Diagnosis: NEGATIVE
HPV (WINDOPATH): NOT DETECTED
Neisseria Gonorrhea: NEGATIVE

## 2017-11-28 ENCOUNTER — Other Ambulatory Visit (HOSPITAL_COMMUNITY)
Admission: RE | Admit: 2017-11-28 | Discharge: 2017-11-28 | Disposition: A | Discharge status: Home / Self Care | Payer: Medicaid Other | Source: Ambulatory Visit | Attending: Family Medicine | Admitting: Family Medicine

## 2017-11-28 ENCOUNTER — Ambulatory Visit (INDEPENDENT_AMBULATORY_CARE_PROVIDER_SITE_OTHER): Payer: Medicaid Other | Admitting: Family Medicine

## 2017-11-28 ENCOUNTER — Other Ambulatory Visit: Payer: Self-pay

## 2017-11-28 VITALS — BP 102/65 | HR 98 | Temp 99.4°F | Wt 212.0 lb

## 2017-11-28 DIAGNOSIS — J309 Allergic rhinitis, unspecified: Secondary | ICD-10-CM

## 2017-11-28 DIAGNOSIS — N898 Other specified noninflammatory disorders of vagina: Secondary | ICD-10-CM

## 2017-11-28 DIAGNOSIS — Z113 Encounter for screening for infections with a predominantly sexual mode of transmission: Secondary | ICD-10-CM | POA: Diagnosis not present

## 2017-11-28 LAB — POCT WET PREP (WET MOUNT)
Clue Cells Wet Prep Whiff POC: NEGATIVE
TRICHOMONAS WET PREP HPF POC: ABSENT

## 2017-11-28 MED ORDER — FLUTICASONE PROPIONATE 50 MCG/ACT NA SUSP: 2.0000 | Freq: Every day | NASAL | 6 refills | Status: DC

## 2017-11-28 MED ORDER — FLUCONAZOLE 150 MG PO TABS: 150.0000 mg | ORAL_TABLET | Freq: Once | ORAL | 0 refills | Status: AC

## 2017-11-28 NOTE — Patient Instructions (Signed)
Take diflucan    We are checking some labs today. If results require attention, either myself or my nurse will get in touch with you. If everything is normal, you will get a letter in the mail or a message in My Chart. Please give [place] a call if you do not hear from [place] after 2 weeks.  Take care,  Dr. Leland Her, DO [facility]

## 2017-11-28 NOTE — Progress Notes (Signed)
    Subjective:  Elizabeth Hines is a 32 y.o. female who presents to the [facility] today for vaginal discharge  HPI:  Vaginal discharge  3-4 days of vaginal discharge, foul odor.  Sexually active with men, uses condoms. Not on any birth control. Not interested in any other forms currently  Needs refills on flonase for her seasonal allergies. Is usually well controlled on flonase. Not in acute flare at this time   ROS: Per HPI  Objective:  Physical Exam: BP 102/65   Pulse 98   Temp 99.4 F (37.4 C) (Oral)   Wt 212 lb (96.2 kg)   LMP 11/04/2017   SpO2 97%   BMI 31.31 kg/m   Gen: NAD, resting comfortably Pulm: NWOB on room air Pelvic exam: normal external genitalia, vulva, vagina, cervix. No CMT Neuro: grossly normal, moves all extremities Psych: Normal affect and thought content  Results for orders placed or performed in visit on 11/28/17 (from the past 72 hour(s))  POCT Wet Prep [facility] [place])     Status: Abnormal   Collection Time: 11/28/17  2:54 PM  Result Value Ref Range   Source Wet Prep POC VAG    WBC, Wet Prep HPF POC NONE    Bacteria Wet Prep HPF POC Moderate (A) Few   Clue Cells Wet Prep HPF POC None None   Clue Cells Wet Prep Whiff POC Negative Whiff    Yeast Wet Prep HPF POC Moderate (A) None   Trichomonas Wet Prep HPF POC Absent Absent     Assessment/Plan:  1. Vaginal discharge Wet prep c/w yeast. Treat with diflucan - POCT Wet Prep ([place]) - fluconazole (DIFLUCAN) 150 MG tablet; Take 1 tablet (150 mg total) by mouth once for 1 dose.  Dispense: 1 tablet; Refill: 0  2. Screening for STD (sexually transmitted disease) STD testing collected today. No CMT on exam today which is reassuring - HIV antibody (with reflex) - RPR - Cervicovaginal ancillary only  3. Allergic rhinitis, unspecified seasonality, unspecified trigger Refills given per patient request - fluticasone (FLONASE) 50 MCG/ACT nasal spray; Place 2 sprays into both nostrils daily.  Dispense: 16 g;  Refill: 6  Patient will need to return for well woman exam. Will forward to PCP and team.  Leland Her, DO PGY-3, [facility] 11/28/2017 2:47 PM

## 2017-11-29 LAB — HIV ANTIBODY (ROUTINE TESTING W REFLEX): HIV Screen 4th Generation wRfx: NONREACTIVE

## 2017-11-29 LAB — CERVICOVAGINAL ANCILLARY ONLY
Chlamydia: NEGATIVE
NEISSERIA GONORRHEA: NEGATIVE

## 2017-11-29 LAB — RPR: RPR Ser Ql: NONREACTIVE

## 2017-11-30 ENCOUNTER — Encounter: Payer: Self-pay | Admitting: Family Medicine

## 2017-12-04 ENCOUNTER — Telehealth: Payer: Self-pay

## 2017-12-04 MED ORDER — METRONIDAZOLE 0.75 % VA GEL: 1.0000 | Freq: Two times a day (BID) | VAGINAL | 0 refills | Status: DC

## 2017-12-04 NOTE — Telephone Encounter (Signed)
Called patient back on listed number, no answer. Left generic VM to return call. If she returns call, metrogel treats bacterial vaginosis, which is separate from a yeast infection. We could consider another dose of fluconazole if she is still having itching or she can try OTC yeast treatments. Please let me know if you hear from her.

## 2017-12-04 NOTE — Telephone Encounter (Signed)
Patient called back. States similar to previous BV episode and wet preps are not 100% specific. Will trial metrogel.

## 2017-12-04 NOTE — Telephone Encounter (Signed)
Patient left message that Diflucan did not help. Wants to know if she should have another or a Rx for Metrogel which she stated she asked for originally.  Call back is [phone]  Ples SpecterAlisa Mirelle Biskup, RN [facility] RN)

## 2017-12-28 ENCOUNTER — Encounter: Payer: Self-pay | Admitting: Family Medicine

## 2017-12-28 ENCOUNTER — Ambulatory Visit (INDEPENDENT_AMBULATORY_CARE_PROVIDER_SITE_OTHER): Payer: Self-pay | Admitting: Family Medicine

## 2017-12-28 VITALS — BP 99/60 | HR 54 | Temp 97.8°F | Ht 69.0 in | Wt 212.4 lb

## 2017-12-28 DIAGNOSIS — N898 Other specified noninflammatory disorders of vagina: Secondary | ICD-10-CM

## 2017-12-28 LAB — POCT WET PREP (WET MOUNT)
CLUE CELLS WET PREP WHIFF POC: POSITIVE
TRICHOMONAS WET PREP HPF POC: ABSENT

## 2017-12-28 MED ORDER — FLUCONAZOLE 150 MG PO TABS: 150.0000 mg | ORAL_TABLET | Freq: Once | ORAL | 0 refills | Status: AC

## 2017-12-28 MED ORDER — METRONIDAZOLE 500 MG PO TABS: 500.0000 mg | ORAL_TABLET | Freq: Two times a day (BID) | ORAL | 0 refills | Status: AC

## 2017-12-28 NOTE — Assessment & Plan Note (Signed)
  Wet prep interestingly has both yeast and clue cells present. Will treat for both. Discussed taking flagyl course before diflucan. Return as needed

## 2017-12-28 NOTE — Patient Instructions (Signed)
  Good to see you today.  We'll treat for both yeast and BV as these both were on your swab today.  Please call us if you have any issues after completing treatment.  If you have questions or concerns please do not hesitate to call at [phone].  Dolores PattyAngela Katelynne Revak, DO PGY-3,  Family Medicine 12/28/2017 11:34 AM

## 2017-12-28 NOTE — Progress Notes (Signed)
    Subjective:    Patient ID: Elizabeth Hines, female    DOB: August 02, 1985, 32 y.o.   MRN: [id number]   CC: vaginal discharge  HPI: patient was seen for discharge on 11/6 and dx w/ yeast infection. She took diflucan but did not have improvement. She called back in and was given rx for metrogel which she finished using but still has discharge. She is not concerned for STDs as these were checked last visit and negative. No pelvic pain no dysuria no flank pain no fevers or chills.  Smoking status reviewed- non-smoker  Review of Systems- see HPI   Objective:  BP 99/60   Pulse (!) 54   Temp 97.8 F (36.6 C) (Oral)   Ht 5\' 9"  (1.753 m)   Wt 212 lb 6 oz (96.3 kg)   LMP 12/01/2017   SpO2 99%   BMI 31.36 kg/m  Vitals and nursing note reviewed  General: well nourished, in no acute distress HEENT: normocephalic, MMM Cardiac: regular rate Respiratory: no increased work of breathing Abdomen: soft, nontender GU: normal external female genitalia, moderate amount of thick white discharge present, no CMT Extremities: no edema or cyanosis Neuro: alert and oriented, no focal deficits   Assessment & Plan:    Vaginal discharge  Wet prep interestingly has both yeast and clue cells present. Will treat for both. Discussed taking flagyl course before diflucan. Return as needed     Return if symptoms worsen or fail to improve.   Dolores PattyAngela Gina Leblond, DO Family Medicine Resident PGY-3

## 2018-02-04 ENCOUNTER — Ambulatory Visit: Payer: Medicaid Other

## 2018-02-11 ENCOUNTER — Ambulatory Visit: Payer: Medicaid Other | Admitting: Family Medicine

## 2018-02-12 ENCOUNTER — Ambulatory Visit (INDEPENDENT_AMBULATORY_CARE_PROVIDER_SITE_OTHER): Payer: Self-pay | Admitting: Family Medicine

## 2018-02-12 ENCOUNTER — Other Ambulatory Visit (HOSPITAL_COMMUNITY)
Admission: RE | Admit: 2018-02-12 | Discharge: 2018-02-12 | Disposition: A | Discharge status: Home / Self Care | Payer: Medicaid Other | Source: Ambulatory Visit | Attending: Family Medicine | Admitting: Family Medicine

## 2018-02-12 VITALS — BP 124/76 | HR 82 | Temp 98.6°F | Wt 210.2 lb

## 2018-02-12 DIAGNOSIS — N898 Other specified noninflammatory disorders of vagina: Secondary | ICD-10-CM | POA: Diagnosis present

## 2018-02-12 LAB — POCT WET PREP (WET MOUNT)
Clue Cells Wet Prep Whiff POC: POSITIVE
Trichomonas Wet Prep HPF POC: ABSENT

## 2018-02-12 MED ORDER — FLUCONAZOLE 150 MG PO TABS: 150.0000 mg | ORAL_TABLET | Freq: Once | ORAL | 0 refills | Status: AC

## 2018-02-12 MED ORDER — METRONIDAZOLE 500 MG PO TABS: 500.0000 mg | ORAL_TABLET | Freq: Two times a day (BID) | ORAL | 0 refills | Status: AC

## 2018-02-12 NOTE — Patient Instructions (Signed)
  Please get Boric Acid suppositories from [employer] and use after you're done with antibiotics. Use once a week.   If you have questions or concerns please do not hesitate to call at [phone].  Dolores Patty, DO PGY-3, [facility] 02/12/2018 2:29 PM

## 2018-02-12 NOTE — Progress Notes (Signed)
    Subjective:    Patient ID: Elizabeth Hines, female    DOB: [birth year], 33 y.o.   MRN: [id number]   CC: vaginal discharge  HPI: patient reports she was seen early December and dx w/ a yeast infection and BV. She took diflucan first then did course of flagyl. She reports the yeast diminished but did not entirely clear up. She reports the odor she experienced was improved with the flagyl. She felt like things were tolerable until last week when the discharge increased in amount and the odor returned. She is frustrated by recurrent infections. She is sexually active with female partners and uses condoms. She has tried OTC Monistat in the past which caused more irritation.   Smoking status reviewed- non-smoker  Review of Systems- see HPI   Objective:  BP 124/76   Pulse 82   Temp 98.6 F (37 C) (Oral)   Wt 210 lb 4 oz (95.4 kg)   LMP 02/03/2018   SpO2 99%   BMI 31.05 kg/m  Vitals and nursing note reviewed  General: well nourished, in no acute distress HEENT: normocephalic, MMM Cardiac: RRR, clear S1 and S2, no murmurs, rubs, or gallops Respiratory: no increased work of breathing Abdomen: soft, nontender GU: normal external female genitalia. Moderate amount of thick white discharge present. Cervix pink without lesions. No CMT. Extremities: no edema or cyanosis Neuro: alert and oriented, no focal deficits   Assessment & Plan:    Vaginal discharge  Wet prep has yeast and clue cells, interestingly. Will treat with flagyl x 7 days and diflucan 2 doses 72 hours apart. Discussed recurrent BV/yeast with patient. She is frustrated. Recommended boric acid suppositories OTC weekly if tolerated. Patient may need to be screened for DM at future visits if yeast is recurrent. Patient verbalized understanding and agreement with plan.     Return if symptoms worsen or fail to improve.   Dolores Patty, DO Family Medicine Resident PGY-3

## 2018-02-13 NOTE — Assessment & Plan Note (Signed)
  Wet prep has yeast and clue cells, interestingly. Will treat with flagyl x 7 days and diflucan 2 doses 72 hours apart. Discussed recurrent BV/yeast with patient. She is frustrated. Recommended boric acid suppositories OTC weekly if tolerated. Patient may need to be screened for DM at future visits if yeast is recurrent. Patient verbalized understanding and agreement with plan.

## 2018-02-14 LAB — CERVICOVAGINAL ANCILLARY ONLY
Chlamydia: NEGATIVE
Neisseria Gonorrhea: NEGATIVE

## 2018-02-14 NOTE — Progress Notes (Signed)
Please let patient know the gonorrhea/chlamydia testing came back negative .

## 2018-05-22 ENCOUNTER — Other Ambulatory Visit: Payer: Self-pay | Admitting: Family Medicine

## 2018-05-22 NOTE — Telephone Encounter (Signed)
Pt requested refill for flagyl. States recurrent vag discharge. She thinks it might be BV again. I asked her to come in so we could eval and discuss consideration of prophylactic metrogel to avoid future infections. Scheduled on [facility] well tomorrow. Will route to Dr. Artist Pais who is seeing her tomorrow.

## 2018-05-23 ENCOUNTER — Other Ambulatory Visit: Payer: Self-pay

## 2018-05-23 ENCOUNTER — Ambulatory Visit (INDEPENDENT_AMBULATORY_CARE_PROVIDER_SITE_OTHER): Payer: Self-pay | Admitting: Family Medicine

## 2018-05-23 ENCOUNTER — Other Ambulatory Visit (HOSPITAL_COMMUNITY)
Admission: RE | Admit: 2018-05-23 | Discharge: 2018-05-23 | Disposition: A | Discharge status: Home / Self Care | Payer: Medicaid Other | Source: Ambulatory Visit | Attending: Family Medicine | Admitting: Family Medicine

## 2018-05-23 VITALS — BP 102/62 | HR 80 | Ht 70.0 in

## 2018-05-23 DIAGNOSIS — B3731 Acute candidiasis of vulva and vagina: Secondary | ICD-10-CM

## 2018-05-23 DIAGNOSIS — B373 Candidiasis of vulva and vagina: Secondary | ICD-10-CM

## 2018-05-23 DIAGNOSIS — N898 Other specified noninflammatory disorders of vagina: Secondary | ICD-10-CM | POA: Diagnosis present

## 2018-05-23 LAB — POCT WET PREP (WET MOUNT)
Clue Cells Wet Prep Whiff POC: NEGATIVE
Trichomonas Wet Prep HPF POC: ABSENT

## 2018-05-23 MED ORDER — FLUCONAZOLE 150 MG PO TABS: 150.0000 mg | ORAL_TABLET | Freq: Once | ORAL | 0 refills | Status: AC

## 2018-05-23 NOTE — Progress Notes (Signed)
    Subjective:  Elizabeth Hines is a 33 y.o. female who presents to the [facility] today with a chief complaint of vaginal discharge.   HPI:  Was recently treated for BV and it never really got better. Still having vaginal discharge with itching and odor. Has not been sexually active sinec last office visit. No urinary symptoms.  ROS: Per HPI   CC, SH/smoking status, and VS noted  Objective:  Physical Exam: BP 102/62   Pulse 80   Ht 5\' 10"  (1.778 m)   LMP 05/12/2018   SpO2 99%   BMI 30.17 kg/m   Gen: NAD, resting comfortably Pelvic exam: normal external genitalia, vulva, vagina Skin: warm, dry Neuro: grossly normal, moves all extremities Psych: Normal affect and thought content  Results for orders placed or performed in visit on 05/23/18 (from the past 72 hour(s))  POCT Wet Prep Mellody [facility]rown [place])     Status: Abnormal   Collection Time: 05/23/18  2:30 PM  Result Value Ref Range   Source Wet Prep POC VAG    WBC, Wet Prep HPF POC 0-3    Bacteria Wet Prep HPF POC Moderate (A) Few   Clue Cells Wet Prep HPF POC None None   Clue Cells Wet Prep Whiff POC Negative Whiff    Yeast Wet Prep HPF POC Moderate (A) None   Trichomonas Wet Prep HPF POC Absent Absent     Assessment/Plan:  1. Yeast infection of the vagina Wet prep c/w yeast. Treat with diflucan - Cervicovaginal ancillary only - POCT Wet Prep ([place]et [place]) - fluconazole (DIFLUCAN) 150 MG tablet; Take 1 tablet (150 mg total) by mouth once for 1 dose.  Dispense: 1 tablet; Refill: 0  Leland Her, DO PGY-3, [facility] 05/23/2018 2:40 PM

## 2018-05-23 NOTE — Patient Instructions (Signed)
Take diflucan pill    Vaginal Yeast infection, Adult  Vaginal yeast infection is a condition that causes vaginal discharge as well as soreness, swelling, and redness (inflammation) of the vagina. This is a common condition. Some women get this infection frequently. What are the causes? This condition is caused by a change in the normal balance of the yeast (candida) and bacteria that live in the vagina. This change causes an overgrowth of yeast, which causes the inflammation. What increases the risk? The condition is more likely to develop in women who:  Take antibiotic medicines.  Have diabetes.  Take birth control pills.  Are pregnant.  Douche often.  Have a weak body defense system (immune system).  Have been taking steroid medicines for a long time.  Frequently wear tight clothing. What are the signs or symptoms? Symptoms of this condition include:  White, thick, creamy vaginal discharge.  Swelling, itching, redness, and irritation of the vagina. The lips of the vagina (vulva) may be affected as well.  Pain or a burning feeling while urinating.  Pain during sex. How is this diagnosed? This condition is diagnosed based on:  Your medical history.  A physical exam.  A pelvic exam. Your health care provider will examine a sample of your vaginal discharge under a microscope. Your health care provider may send this sample for testing to confirm the diagnosis. How is this treated? This condition is treated with medicine. Medicines may be over-the-counter or prescription. You may be told to use one or more of the following:  Medicine that is taken by mouth (orally).  Medicine that is applied as a cream (topically).  Medicine that is inserted directly into the vagina (suppository). Follow these instructions at home:  Lifestyle  Do not have sex until your health care provider approves. Tell your sex partner that you have a yeast infection. That person should go to  his or her health care provider and ask if they should also be treated.  Do not wear tight clothes, such as pantyhose or tight pants.  Wear breathable cotton underwear. General instructions  Take or apply over-the-counter and prescription medicines only as told by your health care provider.  Eat more yogurt. This may help to keep your yeast infection from returning.  Do not use tampons until your health care provider approves.  Try taking a sitz bath to help with discomfort. This is a warm water bath that is taken while you are sitting down. The water should only come up to your hips and should cover your buttocks. Do this 3-4 times per day or as told by your health care provider.  Do not douche.  If you have diabetes, keep your blood sugar levels under control.  Keep all follow-up visits as told by your health care provider. This is important. Contact a health care provider if:  You have a fever.  Your symptoms go away and then return.  Your symptoms do not get better with treatment.  Your symptoms get worse.  You have new symptoms.  You develop blisters in or around your vagina.  You have blood coming from your vagina and it is not your menstrual period.  You develop pain in your abdomen. Summary  Vaginal yeast infection is a condition that causes discharge as well as soreness, swelling, and redness (inflammation) of the vagina.  This condition is treated with medicine. Medicines may be over-the-counter or prescription.  Take or apply over-the-counter and prescription medicines only as told by your health  care provider.  Do not douche. Do not have sex or use tampons until your health care provider approves.  Contact a health care provider if your symptoms do not get better with treatment or your symptoms go away and then return. This information is not intended to replace advice given to you by your health care provider. Make sure you discuss any questions you have  with your health care provider. Document Released: 10/19/2004 Document Revised: 05/28/2017 Document Reviewed: 05/28/2017 Elsevier Interactive Patient Education  2019 [employer].

## 2018-05-23 NOTE — Progress Notes (Signed)
cer

## 2018-05-24 LAB — CERVICOVAGINAL ANCILLARY ONLY
Chlamydia: NEGATIVE
Neisseria Gonorrhea: NEGATIVE

## 2018-05-27 ENCOUNTER — Telehealth: Payer: Self-pay | Admitting: *Deleted

## 2018-05-27 NOTE — Telephone Encounter (Signed)
Patient calls because the diflucan she was given did not help.  She still has discharge, itching and now the "fishy odor is worse".  Pt feels like she has BV because she gets this often, would like to have metronidazole called in if possible since she was just seen.  Will forward to MD. Jone Baseman, CMA

## 2018-05-28 ENCOUNTER — Other Ambulatory Visit: Payer: Self-pay

## 2018-05-28 ENCOUNTER — Ambulatory Visit (INDEPENDENT_AMBULATORY_CARE_PROVIDER_SITE_OTHER): Payer: Self-pay | Admitting: Family Medicine

## 2018-05-28 ENCOUNTER — Encounter: Payer: Self-pay | Admitting: Family Medicine

## 2018-05-28 VITALS — BP 110/62 | HR 70

## 2018-05-28 DIAGNOSIS — B379 Candidiasis, unspecified: Secondary | ICD-10-CM

## 2018-05-28 LAB — POCT WET PREP (WET MOUNT)
Clue Cells Wet Prep Whiff POC: POSITIVE
Trichomonas Wet Prep HPF POC: ABSENT

## 2018-05-28 MED ORDER — FLUCONAZOLE 150 MG PO TABS: 150.0000 mg | ORAL_TABLET | Freq: Once | ORAL | 1 refills | Status: AC

## 2018-05-28 NOTE — Patient Instructions (Addendum)
Complete vaginal rest for the next 2-3 weeks  No soap just water  Probiotics  Diflucan today and again 2 days from now.

## 2018-05-28 NOTE — Telephone Encounter (Signed)
Patient needs in person visit for repeat wet prep. We discussed this at length during her last visit as she has been treated multiple times with continued symptoms.

## 2018-05-28 NOTE — Progress Notes (Signed)
    Subjective:  Elizabeth Hines is a 33 y.o. female who presents to the [facility] today with a chief complaint of vaginal discharge.   HPI:  Patient has had several vaginal infections for the last 6 months.  Her most recent visit was at the end of April when she was treated for a yeast infection.  She is here because she had some temporary improvement but is now back with increased vaginal discharge, intense itching, and a strong odor.  It hurts on the outside.  She has no dysuria, hematuria, urgency or frequency. She has no new sexual partners. She has the same sexual partner. They use a water based lubricant that comes with the condom. She washes with Dove sensitive soap.  She washes her hair separately.  She does not use any scented lotions.  She wears cotton underwear.  She does not douche.  She does not ever sit in damp underwear.  ROS: Per HPI   Objective:  Physical Exam: BP 110/62   Pulse 70   LMP 05/12/2018   Gen: NAD, resting comfortably Pulm: NWOB Neuro: grossly normal, moves all extremities Psych: Normal affect and thought content  Results for orders placed or performed in visit on 05/28/18 (from the past 72 hour(s))  POCT Wet Prep [facility])     Status: Abnormal   Collection Time: 05/28/18  4:58 PM  Result Value Ref Range   Source Wet Prep POC vaginal    WBC, Wet Prep HPF POC 5-10    Bacteria Wet Prep HPF POC Many (A) Few   Clue Cells Wet Prep HPF POC Many (A) None   Clue Cells Wet Prep Whiff POC Positive Whiff    Yeast Wet Prep HPF POC Moderate (A) None   KOH Wet Prep POC Few (A) None   Trichomonas Wet Prep HPF POC Absent Absent     Assessment/Plan:  1. Yeast infection Wet prep c/w yeast but yet also shows clue cells.  Patient has had recurrent vaginal infections for the last 6 months.  Per chart review it has been a mix of BV and yeast.  Her most recent wet prep also showed yeast for which she was treated with a one-time Diflucan.  Given her intense itching sounds like  symptoms are predominantly yeast. Will treat with 2 doses 72 hours apart.  Also discussed complete pelvic rest for the next 2 days.  And trial of probiotics. - POCT Wet Prep (Wet [place]) - fluconazole (DIFLUCAN) 150 MG tablet; Take 1 tablet (150 mg total) by mouth once for 1 dose.  Dispense: 1 tablet; Refill: 1   Orders Placed This Encounter  Procedures  . POCT Wet Prep [facility])    Meds ordered this encounter  Medications  . fluconazole (DIFLUCAN) 150 MG tablet    Sig: Take 1 tablet (150 mg total) by mouth once for 1 dose.    Dispense:  1 tablet    Refill:  1    Leland Her, DO PGY-3, [facility] 05/28/2018 4:07 PM

## 2018-05-28 NOTE — Telephone Encounter (Signed)
LM for patient to call back.  Will send mychart message with information from Dr. Artist Pais.  Nolon Stalls [facility]

## 2018-05-28 NOTE — Telephone Encounter (Signed)
Pt returned call.  Appt made for today @ 4:10. Jone Baseman, CMA

## 2018-09-04 ENCOUNTER — Encounter: Payer: Self-pay | Admitting: Family Medicine

## 2018-09-13 ENCOUNTER — Other Ambulatory Visit: Payer: Self-pay

## 2018-09-13 MED ORDER — METRONIDAZOLE 0.75 % VA GEL: 1.0000 | Freq: Two times a day (BID) | VAGINAL | 0 refills | Status: DC

## 2018-11-29 ENCOUNTER — Ambulatory Visit (INDEPENDENT_AMBULATORY_CARE_PROVIDER_SITE_OTHER): Payer: Self-pay

## 2018-11-29 ENCOUNTER — Encounter (HOSPITAL_COMMUNITY): Payer: Self-pay

## 2018-11-29 ENCOUNTER — Ambulatory Visit (HOSPITAL_COMMUNITY)
Admission: EM | Admit: 2018-11-29 | Discharge: 2018-11-29 | Disposition: A | Discharge status: Home / Self Care | Payer: Self-pay | Attending: Family Medicine | Admitting: Family Medicine

## 2018-11-29 ENCOUNTER — Other Ambulatory Visit: Payer: Self-pay

## 2018-11-29 DIAGNOSIS — S39012A Strain of muscle, fascia and tendon of lower back, initial encounter: Secondary | ICD-10-CM

## 2018-11-29 DIAGNOSIS — S40022A Contusion of left upper arm, initial encounter: Secondary | ICD-10-CM

## 2018-11-29 NOTE — Discharge Instructions (Signed)
X-rays show no fracture  Ibuprofen every 6 hours for soreness.
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
## 2019-04-14 ENCOUNTER — Other Ambulatory Visit: Payer: Self-pay

## 2019-04-14 ENCOUNTER — Ambulatory Visit (INDEPENDENT_AMBULATORY_CARE_PROVIDER_SITE_OTHER): Payer: Self-pay | Admitting: Family Medicine

## 2019-04-14 ENCOUNTER — Encounter: Payer: Self-pay | Admitting: Family Medicine

## 2019-04-14 VITALS — BP 117/80 | HR 99 | Ht 70.0 in | Wt 199.2 lb

## 2019-04-14 DIAGNOSIS — F411 Generalized anxiety disorder: Secondary | ICD-10-CM

## 2019-04-14 DIAGNOSIS — R253 Fasciculation: Secondary | ICD-10-CM

## 2019-04-14 DIAGNOSIS — F959 Tic disorder, unspecified: Secondary | ICD-10-CM | POA: Insufficient documentation

## 2019-04-14 MED ORDER — BUSPIRONE HCL 5 MG PO TABS: 5.0000 mg | ORAL_TABLET | Freq: Two times a day (BID) | ORAL | 1 refills | Status: DC

## 2019-04-14 NOTE — Progress Notes (Signed)
    SUBJECTIVE:   CHIEF COMPLAINT / HPI:   Anxiety Previously was on Buspar, but has been off for many years.  States that in the last few weeks she has wanted to get back on it.  Has been having worsening anxiety, feeling hyper, twitching, problems with racing thoughts, hyperventilating.  Has been controlling hyperventilating episodes with deep breathing.  She just recently lost her aunt, and is losing her grandmother.  She states that work is also stressful.  Reports that she had similar, but worse twitching many years ago when her anxiety was bad.  States  That it got better when she was put on medication for anxiety. Has no SI.  Otherwise has been feeling well.  Really wants to go back on Buspar.    Office Visit from 04/14/2019 in [facility]  PHQ-9 Total Score  12     GAD 7 : Generalized Anxiety Score 04/14/2019 01/22/2015 12/04/2014  Nervous, Anxious, on Edge 3 1 2   Control/stop worrying 2 3 2   Worry too much - different things 3 3 3   Trouble relaxing 2 0 2  Restless 2 1 1   Easily annoyed or irritable 3 3 3   Afraid - awful might happen 3 3 3   Total GAD 7 Score 18 14 16   Anxiety Difficulty Somewhat difficult Not difficult at all Somewhat difficult     PERTINENT  PMH / PSH: History of CIN-1 2009, Last Pap 12/2016 Neg/Neg  OBJECTIVE:   BP 117/80   Pulse 99   Ht 5\' 10"  (1.778 m)   Wt 199 lb 3.2 oz (90.4 kg)   LMP 03/28/2019   SpO2 97%   BMI 28.58 kg/m   Physical Exam:  General: 34 y.o. female in NAD, appears restless Lungs: Breathing comfortably on RA Skin: warm and dry Extremities: No edema Neuro: twitching of upper extremities with occasional brisk movements noted, appears restless Psych: no SI, mood and affect appropriate, thought process linear and logical   ASSESSMENT/PLAN:   Generalized anxiety disorder History of generalized anxiety disorder on chart review and per patient report.  Has previously been on BuSpar and reports that she did  very well with this.  No SI.  Offered other medications to the patient, but she would like to stay with what she has already been on.  We will start with BuSpar 5 mg twice daily.  Patient to follow-up in 2 weeks.  Encouraged to look into therapy, given resources for this.  Discussed with Dr. that these therapy office should take family-planning Medicaid, but we are not sure.  Advised patient that if she is interested in therapy and having difficulty finding someone to reach out.  Twitching Patient states that this occurred previously when her anxiety was worse, states that it was worse many years ago.  Has noticed it now for about a few days.  She is obviously very restless on examination.  It is possible that this is a physical manifestation of her anxiety.  If she does not improve with BuSpar and improvement in her anxiety, would have low threshold to refer her to neurology.     , DO [facility]

## 2019-04-14 NOTE — Assessment & Plan Note (Signed)
History of generalized anxiety disorder on chart review and per patient report.  Has previously been on BuSpar and reports that she did very well with this.  No SI.  Offered other medications to the patient, but she would like to stay with what she has already been on.  We will start with BuSpar 5 mg twice daily.  Patient to follow-up in 2 weeks.  Encouraged to look into therapy, given resources for this.  Discussed with Dr. Shawnee Knapp that these therapy office should take family-planning Medicaid, but we are not sure.  Advised patient that if she is interested in therapy and having difficulty finding someone to reach out.

## 2019-04-14 NOTE — Patient Instructions (Addendum)
Thank you for coming to see me today. It was a pleasure. Today we talked about:   Your anxiety: we will start you on Buspar 5mg  twice daily.  Consider looking into the therapy options.  Please follow-up with me in 2 weeks.  If you have any questions or concerns, please do not hesitate to call the office at [phone].  Best,   [phone], DO    [email or web address]  Therapy and Counseling Resources Most providers on this list will take Medicaid. Patients with commercial insurance or Medicare should contact their insurance company to get a list of in network providers.  [facility]  [address]   [place], [place]      [phone]  [facility] [address]  [place], [place]       [phone]     [facility] [address], [place], [place]    [phone] [facility] [address] [place], [place]  [facility]:  [address] [place]  [facility]:  [address] [place], [place]  [facility] (under & uninsured) [address]   [place]    [email or web address]    [facility] [place] -[address]     Phone:  [phone]     [facility]-  [address]  [phone]   [facility] [address]      [place], [place] ext 1001  [facility]: [address] [place], [place], [place]   [facility] (Spanish therapist) [address] [place]  [address]   [place]    [phone]    [facility]   [phone][address],  [place], [place]   [facility]  [address] [place]  [facility]  [address] [place], [place]         [phone]  Open Access/Walk In Clinic under & uninsured [place],  [address], [address] [phone]):  Mon - Fri from 8 AM - 3 PM  [facility] 02-07-1990,  (Spanish)   [address], [place]: [phone]) 8:30 - 12; 1 - 2:30  [facility] ([phone],  [address], [place]    ([phone]):8:30 - 12; 2 - 3PM  [facility] [place],  [address]  [place]; [phone]):   Mon - Fri 8 AM - 5 PM  [facility] [address] [place]  MWF 12:30 to 3:00 or call to schedule an appointment  [phone]  Specific Provider options Psychology Today  [email or web address] 1. click on find a therapist  2. enter your zip code 3. left side and select or tailor a therapist for your specific need.   [facility] Provider Directory [email or web address]  (Medicaid)   Follow all drop down to find a provider  Social Support program Mental Health [place] [phone] or [phone] [address], [place], [place] Recovery support and educational   In home counseling [facility] Telephone: [phone]  office in [place] [email or web address]   Does not take reg. Medicaid or Medicare private insurance BCCS,  health Choice, [facility], [place], [place], [place], [place] Health Choice  24- Hour Availability:  .  [place] or [phone]  . [facility] the [employer] [phone]  [facility] Crisis Service  [phone]   . [place]  [phone] (after hours)  . [facility]/Mobile Crisis   [phone]  . [place] National Suicide Hotline  614-581-1547 ([place])  . Call 911 or go to emergency room  . [employer]  [phone]);  [place] and [employer]   . [facility] ACCESS  [phone]); [place], [place], [place], [place],  [place], [place], [place]

## 2019-04-14 NOTE — Assessment & Plan Note (Signed)
Patient states that this occurred previously when her anxiety was worse, states that it was worse many years ago.  Has noticed it now for about a few days.  She is obviously very restless on examination.  It is possible that this is a physical manifestation of her anxiety.  If she does not improve with BuSpar and improvement in her anxiety, would have low threshold to refer her to neurology.

## 2019-04-29 ENCOUNTER — Other Ambulatory Visit: Payer: Self-pay

## 2019-04-29 ENCOUNTER — Encounter: Payer: Self-pay | Admitting: Family Medicine

## 2019-04-29 ENCOUNTER — Ambulatory Visit (INDEPENDENT_AMBULATORY_CARE_PROVIDER_SITE_OTHER): Payer: Self-pay | Admitting: Family Medicine

## 2019-04-29 VITALS — BP 108/62 | HR 67 | Ht 70.0 in | Wt 197.4 lb

## 2019-04-29 DIAGNOSIS — R253 Fasciculation: Secondary | ICD-10-CM

## 2019-04-29 DIAGNOSIS — F411 Generalized anxiety disorder: Secondary | ICD-10-CM

## 2019-04-29 DIAGNOSIS — F959 Tic disorder, unspecified: Secondary | ICD-10-CM

## 2019-04-29 MED ORDER — BUSPIRONE HCL 5 MG PO TABS: 10.0000 mg | ORAL_TABLET | Freq: Two times a day (BID) | ORAL | 1 refills | Status: DC

## 2019-04-29 NOTE — Progress Notes (Signed)
    SUBJECTIVE:   CHIEF COMPLAINT / HPI:   Anxiety: has been taking buspar twice a day.  This dose is smaller than previous doses.  Takes the edge off but anxiety still present. Experiences 'panic episodes' in which she Has racing heartbeat, hyperventilation. These happen without trigger.    Twitching: her body 'moves faster than normal' when she is tired.  Also has noticeable neck turning/twisting which other people have noticed..  These occurred with prevoius anxiety episodes.  Has paternal cousin with anxiety. No family hx of tic disorder  Has had a daily persistent dry cough for years.      PERTINENT  PMH / PSH: anxiety  OBJECTIVE:   BP 108/62   Pulse 67   Ht 5\' 10"  (1.778 m)   Wt 197 lb 6.4 oz (89.5 kg)   LMP 04/24/2019 (Exact Date)   SpO2 100%   BMI 28.32 kg/m   Gen: alert oriented.  Appears anxious.   Cv: RRR.  No murmurs Pulm: LCTAB. No crackles or wheezes.  Psych: pt having non-rhythmic jerking of the head and upper extremities during encounter.  Multiple mild dry coughs during encounter.   ASSESSMENT/PLAN:   Generalized anxiety disorder Slightly improved with buspar.  Pt states she was previously on higher dose of buspar which worked well for her.  Gad-7 and phq-9 both elevated, though after discussion her symptoms appear more weighted towards anxiety than depression.  Will increase buspar to 10mg  BID.  Can increase further if necessary, but will likely add an SSRI on next visit if anxiety is still undertreated.     Tic disorder Pt having noticeable upper body motor tics during encounter involving turning of her head to the side and spastic movements of her arms, mainly right arm. She states these issues get worse when her anxiety is worse.  I also believe her chronic cough is a vocal tic, which has been present for years, per the patient.  This would make tourette's syndrome the most likely diagnosis, but will do further monitoring and see how it changes as anxiety  improves before making a formal diagnosis. She is not currently or recently on any antipsychotics or other medications that would cause akathesia or dyskinesia.   Can consider clonidine in the future but I want to get her anxiety under control first before starting something like clonidine/guanfacine.      06/24/2019, MD [facility]

## 2019-04-29 NOTE — Patient Instructions (Addendum)
I have increased your buspar to 10mg  twice a day.  I would like to see you back in the beginning of may for a follow up.  We may also consider starting another medication at that time. I have scheduled an appt for may 3d at 210pm.      Outpatient Mental Health Providers (No Insurance at time of Visit or Self Pay)  [facility] ([place]) walk in M-F 8am-12pm and  1pm-3pm [place]- [address]     [phone]  [phone] -[address]  Phone: [phone] Mon-Fri, 8:30-5:00  [address] [place], [place]    [phone]  [phone] (Immediate assistance)  [facility]    Walk-in Mon-Fri, 8am-3pm [address] [place], [place]  [place]  [email or web address] [facility] (Mental Health and substance challenges) [address]   [place]    [email or web address]    [facility]  [facility] -[address], [address]     Phone:  [phone] [facility]-  [address]  [phone]         [facility] Walk-in MWF 12:30 to 3:00     [address] [place]  [phone]  [email or web address] call to schedule an appointment    Mental Health [facility] Classes ,Support group, Peer support services, [address] [place], [place] [phone]  [phone]           National Alliance on Mental Illness (NAMI) [place]- Wellness classes, Support groups        [address] [place], [place] [phone]   [phone]  [facility]  (Psycho-social Rehabilitation clubhouse, Individual and group therapy) [address] [place], [place]   [phone]  24- Hour Availability:  [phone] Behavioral Health [phone] or [phone] * [facility] the [birth year]-1994 (Domestic Violence, Rape, etc. )[phone] or  [phone] * [facility] [place] Crisis Services [phone] [phone] (after hours) *Therapeutic Alternative Mobile Crisis Unit [phone] *[phone] National Suicide Hotline [phone])      Tic Disorders A tic disorder is a condition in which a person makes sudden and repeated movements or sounds (tics). There are three types of tic disorders:  Transient or provisional tic disorder (common). This type usually goes away within a year or two.  Chronic or persistent tic disorder. This type may last all through childhood and continue into the adult years.  Tourette syndrome (rare). This type lasts through all of life. It often occurs with other disorders. Tic disorders starts before age 69, usually between age of 2 and 42. These disorders cannot be cured, but there are many treatments that can help manage tics. Most tic disorders get better over time. What are the causes? The cause of this condition is not known. What are the signs or symptoms? The main symptom of this condition is experiencing tics. There are four type of tics:  Simple motor tics. These are movements in one area of the body.  Complex motor tics. These are movements in large areas or in several areas of the body.  Simple vocal tics. These are single sounds.  Complex vocal tics. These are sounds that include several words or phrases. Tics range in severity and may be more severe when you are stressed or tired. Tics can change over time. Symptoms  of simple motor tics  Blinking, squinting, or eyebrow raising.  Nose wrinkling.  Mouth twitching, grimacing, or making tongue movements.  Head nodding or twisting.  Shoulder shrugging.  Arm jerking.  Foot shaking. Symptoms of complex motor tics  Grooming behavior, such as combing one's hair.  Smelling objects.  Jumping.  Imitating others' behavior.  Making rude or obscene gestures. Symptoms of simple vocal  tics  Coughing.  Humming.  Throat clearing.  Grunting.  Yawning.  Sniffing.  Barking.  Snorting. Symptoms of complex vocal tics  Imitating what others say.  Saying words and sentences that may: ? Seem out of context. ? Be rude. How is this diagnosed? This condition is diagnosed based on:  Your symptoms.  Your medical history.  A physical exam.  An exam of your nervous system (neurological exam).  Tests. These may be done to rule out other conditions that cause symptoms like tics. Tests may include: ? Blood tests. ? Brain imaging tests. Your health care provider will ask you about:  The type of tics you have.  When the tics started and how often they happen.  How the tics affect your daily activities.  Other medical issues you may have.  Whether you take over-the-counter or prescription medicines.  Whether you use any drugs. You may be referred to a brain and nerve specialist (neurologist) or a mental health specialist for further evaluation. How is this treated? Treatment for this condition depends on how severe your tics are. If they are mild, you may not need treatment. If they are more severe, you may benefit from treatment. Some treatments include:  Cognitive behavioral therapy. This kind of therapy involves talking to a mental health professional. The therapist can help you to: ? Become more aware of your tics. ? Learn ways to control your tics. ? Know how to disguise your tics.  Family therapy. This kind of therapy provides education and emotional support for your family members.  Medicine that helps to control tics.  Medicine that is injected into the body to relax muscles (botulinum toxin). This may be a treatment option if your tics are severe.  Electrical stimulation of the brain (deep brain stimulation). This may be a treatment option if your tics are severe. Follow these instructions at home:  Take over-the-counter and prescription  medicines only as told by your health care provider.  Check with your health care provider before using any new prescription or over-the-counter medicines.  Keep all follow-up visits as told by your health care provider. This is important. Contact a health care provider if:  You are not able to take your medicines as prescribed.  Your symptoms get worse.  Your symptoms are interfering with your ability to function normally at home, work, or school.  You have new or unusual symptoms like pain or weakness.  Your symptoms make you feel depressed or anxious. Summary  A tic disorder is a condition in which a person makes sudden and repeated movements or sounds.  Tic disorders start before age 3, usually between the age of 1 and 50.  Many tic disorders are mild and do not need treatment.  These disorders cannot be cured, but there are many treatments that can help manage tics. This information is not intended to replace advice given to you by your health care provider. Make sure you discuss any questions you have with your health care provider. Document Revised: 12/22/2016 Document Reviewed: 01/28/2016 Elsevier Patient Education  2020 [employer].

## 2019-04-29 NOTE — Assessment & Plan Note (Signed)
Slightly improved with buspar.  Pt states she was previously on higher dose of buspar which worked well for her.  Gad-7 and phq-9 both elevated, though after discussion her symptoms appear more weighted towards anxiety than depression.  Will increase buspar to 10mg  BID.  Can increase further if necessary, but will likely add an SSRI on next visit if anxiety is still undertreated.

## 2019-04-29 NOTE — Assessment & Plan Note (Addendum)
Pt having noticeable upper body motor tics during encounter involving turning of her head to the side and spastic movements of her arms, mainly right arm. She states these issues get worse when her anxiety is worse.  I also believe her chronic cough is a vocal tic, which has been present for years, per the patient.  This would make tourette's syndrome the most likely diagnosis, but will do further monitoring and see how it changes as anxiety improves before making a formal diagnosis. She is not currently or recently on any antipsychotics or other medications that would cause akathesia or dyskinesia.   Can consider clonidine in the future but I want to get her anxiety under control first before starting something like clonidine/guanfacine.

## 2019-04-30 LAB — BASIC METABOLIC PANEL
BUN/Creatinine Ratio: 13 (ref 9–23)
BUN: 9 mg/dL (ref 6–20)
CO2: 19 mmol/L — ABNORMAL LOW (ref 20–29)
Calcium: 9.1 mg/dL (ref 8.7–10.2)
Chloride: 104 mmol/L (ref 96–106)
Creatinine, Ser: 0.72 mg/dL (ref 0.57–1.00)
GFR calc Af Amer: 126 mL/min/{1.73_m2} (ref >59)
GFR calc non Af Amer: 110 mL/min/{1.73_m2} (ref >59)
Glucose: 105 mg/dL — ABNORMAL HIGH (ref 65–99)
Potassium: 4.1 mmol/L (ref 3.5–5.2)
Sodium: 138 mmol/L (ref 134–144)

## 2019-05-26 ENCOUNTER — Other Ambulatory Visit: Payer: Self-pay

## 2019-05-26 ENCOUNTER — Ambulatory Visit (INDEPENDENT_AMBULATORY_CARE_PROVIDER_SITE_OTHER): Payer: Self-pay | Admitting: Family Medicine

## 2019-05-26 ENCOUNTER — Encounter: Payer: Self-pay | Admitting: Family Medicine

## 2019-05-26 DIAGNOSIS — F411 Generalized anxiety disorder: Secondary | ICD-10-CM

## 2019-05-26 DIAGNOSIS — M25552 Pain in left hip: Secondary | ICD-10-CM

## 2019-05-26 DIAGNOSIS — F959 Tic disorder, unspecified: Secondary | ICD-10-CM

## 2019-05-26 MED ORDER — BUSPIRONE HCL 5 MG PO TABS: 10.0000 mg | ORAL_TABLET | Freq: Two times a day (BID) | ORAL | 3 refills | Status: DC

## 2019-05-26 NOTE — Assessment & Plan Note (Signed)
Mild.  Appears to be musculoskeletal related to her sleep position.  Encourage patient to change positions during sleep, use heating pad or other heat source, and to use ibuprofen or Tylenol in limited duration.  This will likely improve on its own over time.

## 2019-05-26 NOTE — Progress Notes (Signed)
    SUBJECTIVE:   CHIEF COMPLAINT / HPI:   Anxiety/tics: feels like her anxiety and tics are improving since increasing the dose of BuSpar.  She has not noticed any tics until she gets in her car after a long day at work.  She states the tics resolve after she gets out of her car again.  She reached out to therapist through her employer, but stated nobody got back to her and this has not gone anywhere since.  She has not reached out to any other therapist since that time.  Left hip pain: Has been having pain in her left side since March.  It is located near the top of her hip.  She lays on her left side when she sleeps and the pain is worse in the morning.  The pain resolves throughout the day.  She is not taking any medication to relieve the pain.  Not complaining of dysuria.  Pain is not associated with menstrual cycle.  Pain has been the same, has not improved or worsened.  No inciting event she can recall.     PERTINENT  PMH / PSH: Anxiety  OBJECTIVE:   BP 110/60   Pulse 77   Ht 5\' 10"  (1.778 m)   Wt 196 lb (88.9 kg)   LMP 04/24/2019 (Exact Date)   SpO2 100%   BMI 28.12 kg/m   General: Alert and oriented.  No acute distress CV: Regular rate and rhythm.  No murmurs. Pulmonary: Lungs clear to auscultation bilaterally, no wheezes. MSK: Tenderness palpation on the iliac crest on the left side.  No tenderness elsewhere.  Normal range of motion. Psych: Patient appears much calmer and relaxed today than previous exam.  No visible tics seen during this appointment.  ASSESSMENT/PLAN:   Generalized anxiety disorder Much improved since last visit.  Patient satisfied with her current pharmacological regimen.  We will continue BuSpar 10 mg twice a day.  Advised patient to follow-up with the therapist she reached out to earlier and if unsuccessful to call one of the therapist listed in the handout provided to her. -Follow-up in 3 months.  Tic disorder Patient states tics have decreased in  frequency.  Only notices them in the car ride home after work.  No tics seen during encounter today.  I believe her tics were greatly exacerbated by her anxiety and the BuSpar has improved her tics by reducing her anxiety -Continue BuSpar  Left hip pain Mild.  Appears to be musculoskeletal related to her sleep position.  Encourage patient to change positions during sleep, use heating pad or other heat source, and to use ibuprofen or Tylenol in limited duration.  This will likely improve on its own over time.     06/24/2019, MD [facility]

## 2019-05-26 NOTE — Assessment & Plan Note (Signed)
Patient states tics have decreased in frequency.  Only notices them in the car ride home after work.  No tics seen during encounter today.  I believe her tics were greatly exacerbated by her anxiety and the BuSpar has improved her tics by reducing her anxiety -Continue BuSpar

## 2019-05-26 NOTE — Patient Instructions (Signed)
It was great to see you today.    I'm glad that you are anxiety and tics are improving.  We will continue on the current dose of BuSpar that you have not had any other new medications for now.  Please reach back out to the E[facility] once more and if you still do not have any luck reaching them you can contact one of the other therapist listed below.  Please come back in 3 months for follow-up.  Have a great day,  Frederic Jericho, MD  Outpatient Mental Health Providers (No Insurance at time of Visit or Self Pay)  [facility] Mon-Fri, 8:30-5:00  [address] [place], [place] [address]    [phone] [email or web address]  [phone] (Immediate assistance)  [facility]    Walk-in Mon-Fri, 8am-3pm [address], [place], [place]  [phone]  [email or web address]  [facility] [place] (Habla [place]ol) walk in M-F 8am-12pm and  1pm-3pm [place]- [address]     [phone]  [employer] [address]  Phone: [phone]  [facility] (Mental Health and substance challenges) [address]   [place] [phone]    [email or web address]    [facility]  [place] -[address], [place]     Phone:  [phone] [facility]-  [address]  [phone]         [facility] Walk-in MWF 12:30 to 3:00     [address] [place] [address]  [phone]  [email or web address] call to schedule an appointment    Mental Health [facility] Classes ,Support group, Peer support services, [address], [place], [place] [address] [phone]  [email or web address]           National Alliance on Mental Illness (NAMI) [place]- Wellness classes, Support groups        [address] [place], [place] [address] [phone]   [email or web address]  [facility]  (Psycho-social Rehabilitation clubhouse, Individual and group therapy) [address] [place], [place] [address]   [phone]  24- Hour  Availability:  [facility] [phone] or [phone] * [facility] [place] (Domestic Violence, Rape, etc. )[phone] Vesta Mixer [phone] or [phone] * [facility] [place] [facility] [phone] only) [phone] (after hours) *Therapeutic Alternative Mobile Crisis Unit [phone] *[place] National Suicide Hotline [phone] Len Childs)

## 2019-05-26 NOTE — Assessment & Plan Note (Addendum)
Much improved since last visit.  Patient satisfied with her current pharmacological regimen.  We will continue BuSpar 10 mg twice a day.  Advised patient to follow-up with the therapist she reached out to earlier and if unsuccessful to call one of the therapist listed in the handout provided to her. -Follow-up in 3 months.

## 2019-08-19 ENCOUNTER — Encounter: Payer: Self-pay | Admitting: Family Medicine

## 2019-08-19 ENCOUNTER — Other Ambulatory Visit (HOSPITAL_COMMUNITY)
Admission: RE | Admit: 2019-08-19 | Discharge: 2019-08-19 | Disposition: A | Discharge status: Home / Self Care | Payer: Medicaid Other | Source: Ambulatory Visit | Attending: Family Medicine | Admitting: Family Medicine

## 2019-08-19 ENCOUNTER — Other Ambulatory Visit: Payer: Self-pay

## 2019-08-19 ENCOUNTER — Ambulatory Visit (INDEPENDENT_AMBULATORY_CARE_PROVIDER_SITE_OTHER): Payer: Self-pay | Admitting: Family Medicine

## 2019-08-19 VITALS — BP 105/80 | HR 90 | Ht 69.49 in | Wt 198.6 lb

## 2019-08-19 DIAGNOSIS — Z7251 High risk heterosexual behavior: Secondary | ICD-10-CM | POA: Diagnosis present

## 2019-08-19 DIAGNOSIS — Z113 Encounter for screening for infections with a predominantly sexual mode of transmission: Secondary | ICD-10-CM | POA: Diagnosis present

## 2019-08-19 DIAGNOSIS — R300 Dysuria: Secondary | ICD-10-CM | POA: Insufficient documentation

## 2019-08-19 DIAGNOSIS — B373 Candidiasis of vulva and vagina: Secondary | ICD-10-CM

## 2019-08-19 DIAGNOSIS — N898 Other specified noninflammatory disorders of vagina: Secondary | ICD-10-CM

## 2019-08-19 DIAGNOSIS — B3731 Acute candidiasis of vulva and vagina: Secondary | ICD-10-CM

## 2019-08-19 LAB — POCT WET PREP (WET MOUNT)
Clue Cells Wet Prep Whiff POC: NEGATIVE
Trichomonas Wet Prep HPF POC: ABSENT

## 2019-08-19 MED ORDER — FLUCONAZOLE 150 MG PO TABS: ORAL_TABLET | ORAL | 0 refills | Status: DC

## 2019-08-19 MED ORDER — CEPHALEXIN 500 MG PO CAPS: 500.0000 mg | ORAL_CAPSULE | Freq: Four times a day (QID) | ORAL | 0 refills | Status: AC

## 2019-08-19 NOTE — Assessment & Plan Note (Addendum)
Per patient request, STD testing obtained -Follow-up GC/CL, HIV, RPR, trichomonas, Hep C -Will treat if indicated

## 2019-08-19 NOTE — Assessment & Plan Note (Signed)
Patient with symptoms consistent with urinary tract infection.  Given lack of likely insurance coverage for urinalysis and urine culture, will empirically treat. -Keflex 500 mg 4 times daily x5 days -Patient to contact develops recurrent symptoms consistent with yeast infection patient of antibiotics

## 2019-08-19 NOTE — Patient Instructions (Signed)
Thank you for coming to see me today. It was a pleasure to see you.   I have prescribed you an antibiotic called Keflex.  You will need to take this 4 times a day for 5 days.  Your physical exam also was concerning for yeast infection.  I called in a prescription called Diflucan.  You will need to take 1 tablet now and then another tablet in 72 hours.  Sometimes antibiotics can cause yeast infection so if you have recurrence of symptoms after the completion of your antibiotics please do not hesitate to contact me I will call you in another prescription.  Please try to use nonlatex condoms to help avoid irritation.  I I have obtained other STD screening.  I will call you if anything is abnormal or needs to be treated, otherwise I will send you a letter with the results.  You may also see your results on MyChart.  If you have any questions or concerns, please do not hesitate to call the office at [phone].  Take Care,  Dr. Orpah Cobb, DO Resident Physician [facility] [phone]

## 2019-08-19 NOTE — Assessment & Plan Note (Signed)
Exam and wet prep positive for yeast.  Diflucan 150mg : 1 tab now, then 1 tab in 72 hours

## 2019-08-19 NOTE — Progress Notes (Signed)
   Subjective:   Patient ID: Elizabeth Hines    DOB: [birth year], 34 y.o. female   MRN: [id number]  Elizabeth Hines is a 34 y.o. female with a history of allergic rhinitis, tic disorder, history of CIN-1 cervical dysplasia, anemia, GAD, overweight here for vaginal irritation.  Vaginal Irritation: Patient presenting with vaginal itching and irritation for 1 week.  She notes increased discharge that is white with a slight odor.  Denies any dyspareunia.  She has tried to treat with boric acid which helps a little bit.  She has used Monistat over-the-counter in the past but this causes increased irritation.  She is sexually active with 2 female partners in the last year and uses condoms regularly however she does note that there is occasional times without condom use.  She is interested in STD testing.  She does feel latex condoms are irritating her.  Dysuria: Patient endorses dysuria, increased frequency, and urgency for 1 week.  Denies any fevers, chills, flank pain.   Review of Systems:  Per HPI.   Objective:   BP 105/80   Pulse 90   Ht 5' 9.49" (1.765 m)   Wt 198 lb 9.6 oz (90.1 kg)   LMP 07/29/2019 (Exact Date)   SpO2 100%   BMI 28.92 kg/m  Vitals and nursing note reviewed.  General: Pleasant young female, sitting comfortably on exam bed, well nourished, well developed, in no acute distress with non-toxic appearance Resp: Breathing comfortably on room air, speaking full sentences Skin: warm, dry Extremities: warm and well perfused MSK:  gait normal Neuro: Alert and oriented, speech normal Pelvic exam: VULVA: normal appearing vulva with no masses, tenderness or lesions, VAGINA: vaginal discharge - white, curd-like and thick, CERVIX: lesions absent, cervical discharge present - white, curd-like and thick Exam chaperoned by Gilberto Better.   Assessment & Plan:   Vaginal candidiasis Exam and wet prep positive for yeast.  Diflucan 150mg : 1 tab now, then 1 tab in 72  hours  Dysuria Patient with symptoms consistent with urinary tract infection.  Given lack of likely insurance coverage for urinalysis and urine culture, will empirically treat. -Keflex 500 mg 4 times daily x5 days -Patient to contact develops recurrent symptoms consistent with yeast infection patient of antibiotics  Screening for STDs (sexually transmitted diseases) Per patient request, STD testing obtained -Follow-up GC/CL, HIV, RPR, trichomonas -Will treat if indicated  Orders Placed This Encounter  Procedures  . HIV Antibody (routine testing w rflx)  . RPR  . Hepatitis C antibody  . POCT Wet Prep [facility])   Meds ordered this encounter  Medications  . cephALEXin (KEFLEX) 500 MG capsule    Sig: Take 1 capsule (500 mg total) by mouth 4 (four) times daily for 5 days.    Dispense:  20 capsule    Refill:  0  . fluconazole (DIFLUCAN) 150 MG tablet    Sig: Take 1 tablet (150mg ) today, then another tablet in 72 hours.    Dispense:  2 tablet    Refill:  0    [facility], DO PGY-3, [facility] 08/19/2019 8:08 PM

## 2019-08-20 LAB — CERVICOVAGINAL ANCILLARY ONLY
Chlamydia: NEGATIVE
Comment: NEGATIVE
Comment: NEGATIVE
Comment: NORMAL
Neisseria Gonorrhea: NEGATIVE
Trichomonas: NEGATIVE

## 2019-08-20 LAB — RPR: RPR Ser Ql: NONREACTIVE

## 2019-08-20 LAB — HEPATITIS C ANTIBODY: Hep C Virus Ab: <0.1 s/co ratio (ref 0.0–0.9)

## 2019-08-20 LAB — HIV ANTIBODY (ROUTINE TESTING W REFLEX): HIV Screen 4th Generation wRfx: NONREACTIVE

## 2019-09-17 ENCOUNTER — Ambulatory Visit: Payer: Medicaid Other | Admitting: Family Medicine

## 2019-09-24 ENCOUNTER — Ambulatory Visit: Payer: Medicaid Other | Admitting: Family Medicine

## 2019-09-24 NOTE — Progress Notes (Deleted)
° ° °  SUBJECTIVE:   CHIEF COMPLAINT / HPI:   Anxiety/tics: buspar.    PERTINENT  PMH / PSH: ***  OBJECTIVE:   There were no vitals taken for this visit.  ***  ASSESSMENT/PLAN:   No problem-specific Assessment & Plan notes found for this encounter.     Sandre Kitty, MD [facility]

## 2019-10-28 ENCOUNTER — Other Ambulatory Visit: Payer: Self-pay | Admitting: Family Medicine

## 2019-10-28 DIAGNOSIS — N898 Other specified noninflammatory disorders of vagina: Secondary | ICD-10-CM

## 2019-10-29 MED ORDER — FLUCONAZOLE 150 MG PO TABS: ORAL_TABLET | ORAL | 0 refills | Status: DC

## 2020-03-26 ENCOUNTER — Other Ambulatory Visit (HOSPITAL_COMMUNITY)
Admission: RE | Admit: 2020-03-26 | Discharge: 2020-03-26 | Disposition: A | Discharge status: Home / Self Care | Payer: Medicaid Other | Source: Ambulatory Visit | Attending: Family Medicine | Admitting: Family Medicine

## 2020-03-26 ENCOUNTER — Encounter: Payer: Self-pay | Admitting: Family Medicine

## 2020-03-26 ENCOUNTER — Telehealth: Payer: Self-pay

## 2020-03-26 ENCOUNTER — Ambulatory Visit (INDEPENDENT_AMBULATORY_CARE_PROVIDER_SITE_OTHER): Payer: Medicaid Other | Admitting: Family Medicine

## 2020-03-26 ENCOUNTER — Other Ambulatory Visit: Payer: Self-pay

## 2020-03-26 VITALS — BP 104/70 | HR 75 | Ht 70.0 in | Wt 200.4 lb

## 2020-03-26 DIAGNOSIS — N898 Other specified noninflammatory disorders of vagina: Secondary | ICD-10-CM | POA: Diagnosis not present

## 2020-03-26 DIAGNOSIS — R300 Dysuria: Secondary | ICD-10-CM | POA: Diagnosis not present

## 2020-03-26 DIAGNOSIS — Z124 Encounter for screening for malignant neoplasm of cervix: Secondary | ICD-10-CM | POA: Diagnosis not present

## 2020-03-26 DIAGNOSIS — B373 Candidiasis of vulva and vagina: Secondary | ICD-10-CM | POA: Diagnosis not present

## 2020-03-26 DIAGNOSIS — Z113 Encounter for screening for infections with a predominantly sexual mode of transmission: Secondary | ICD-10-CM | POA: Diagnosis present

## 2020-03-26 LAB — POCT WET PREP (WET MOUNT)
Clue Cells Wet Prep Whiff POC: NEGATIVE
Trichomonas Wet Prep HPF POC: ABSENT

## 2020-03-26 NOTE — Telephone Encounter (Signed)
Patient calls nurse line regarding wet prep results from this morning. Patient is asking if prescription will be sent into the pharmacy.   Forwarding to Dr. Dareen Piano.   Veronda Prude, RN

## 2020-03-26 NOTE — Patient Instructions (Signed)
Thank you for coming in to see [place] today! Please see below to review our plan for today's visit:  1. We are testing your urine today and doing an STI screen for good health maintenance. We will call you with results and treat as needed.    Please call the clinic at [phone] if your symptoms worsen or you have any concerns. It was our pleasure to serve you!   Dr. Peggyann Shoals [facility]

## 2020-03-26 NOTE — Progress Notes (Signed)
    SUBJECTIVE:   CHIEF COMPLAINT / HPI:   Concern for yeast infection: Patient reports few days of vaginal itch, discharge.  Denies vaginal odor, pelvic pain.  Also denies body aches, fevers, chills.  Would like to be checked for STIs.  Dysuria: Patient reports pain with urination, some urinary frequency, urgency.  Denies lower abdominal or back pain.  Due for Pap: Per patient's chart she is due for a Pap smear, will plan to perform Pap at today's visit.  PERTINENT  PMH / PSH:  Patient Active Problem List   Diagnosis Date Noted  . Encounter for screening for cervical cancer 03/29/2020  . Vaginal candidiasis 08/19/2019  . Dysuria 08/19/2019  . Screening for STDs (sexually transmitted diseases) 08/19/2019  . Generalized anxiety disorder 04/14/2019  . Tic disorder 04/14/2019  . Allergic rhinitis 02/02/2014  . Vaginal itching 02/02/2014  . Anemia 06/11/2012  . Overweight (BMI 25.0-29.9) 03/28/2011  . CIN I (cervical intraepithelial neoplasia I) 11/22/2010     OBJECTIVE:   BP 104/70   Pulse 75   Ht 5\' 10"  (1.778 m)   Wt 200 lb 6 oz (90.9 kg)   LMP 03/02/2020   SpO2 98%   BMI 28.75 kg/m    General: Well-appearing patient, no apparent distress Respiratory: Comfortable work of breathing, speaking complete sentences on room air GU: Labia minora and majora without lesion or abrasion; thick white discharge appreciated in vagina, closed cervical os, cervix without lesion or bleeding  ASSESSMENT/PLAN:   Dysuria Urinalysis performed, however has not yet resulted. -Follow-up with results, treat as needed  Encounter for screening for cervical cancer -Patient due for Pap smear, performed at today's visit 3/4  Vaginal itching -Patient found to have yeast infection on wet prep today -Treat with Diflucan 150 mg x 1     04/30/2020, DO [facility]

## 2020-03-27 LAB — RPR: RPR Ser Ql: NONREACTIVE

## 2020-03-27 LAB — HIV ANTIBODY (ROUTINE TESTING W REFLEX): HIV Screen 4th Generation wRfx: NONREACTIVE

## 2020-03-29 DIAGNOSIS — Z124 Encounter for screening for malignant neoplasm of cervix: Secondary | ICD-10-CM | POA: Insufficient documentation

## 2020-03-29 LAB — POCT URINALYSIS DIP (MANUAL ENTRY)
Bilirubin, UA: NEGATIVE
Blood, UA: NEGATIVE
Glucose, UA: NEGATIVE mg/dL
Ketones, POC UA: NEGATIVE mg/dL
Leukocytes, UA: NEGATIVE
Nitrite, UA: NEGATIVE
Protein Ur, POC: NEGATIVE mg/dL
Spec Grav, UA: 1.02 (ref 1.010–1.025)
Urobilinogen, UA: 0.2 E.U./dL
pH, UA: 7.5 (ref 5.0–8.0)

## 2020-03-29 MED ORDER — FLUCONAZOLE 150 MG PO TABS: 150.0000 mg | ORAL_TABLET | Freq: Once | ORAL | 0 refills | Status: AC

## 2020-03-29 NOTE — Assessment & Plan Note (Signed)
-  Patient due for Pap smear, performed at today's visit 3/4

## 2020-03-29 NOTE — Assessment & Plan Note (Signed)
-  Patient found to have yeast infection on wet prep today -Treat with Diflucan 150 mg x 1

## 2020-03-29 NOTE — Telephone Encounter (Signed)
Patient returns call to nurse line regarding previous message.   Please advise.   Ronnika Collett C Kiala Faraj, RN  

## 2020-03-29 NOTE — Assessment & Plan Note (Signed)
Urinalysis performed, however has not yet resulted. -Follow-up with results, treat as needed

## 2020-03-29 NOTE — Telephone Encounter (Signed)
They are in now. Aquilla Solian, CMA

## 2020-03-30 LAB — CYTOLOGY - PAP
Adequacy: ABSENT
Chlamydia: NEGATIVE
Comment: NEGATIVE
Comment: NEGATIVE
Comment: NORMAL
Diagnosis: NEGATIVE
High risk HPV: POSITIVE — AB
Neisseria Gonorrhea: NEGATIVE

## 2020-03-30 NOTE — Telephone Encounter (Signed)
Patient calls nurse line regarding diflucan rx. Patient was wondering if she could have a second pill if needed to clear up yeast infection. Advised patient to contact us by Thursday or Friday if symptoms have not improved. Patient verbalizes understanding.   Veronda Prude, RN

## 2020-04-12 ENCOUNTER — Other Ambulatory Visit: Payer: Self-pay | Admitting: Family Medicine

## 2020-04-12 ENCOUNTER — Encounter: Payer: Self-pay | Admitting: Family Medicine

## 2020-04-12 MED ORDER — FLUCONAZOLE 150 MG PO TABS: 150.0000 mg | ORAL_TABLET | Freq: Once | ORAL | 0 refills | Status: AC

## 2020-04-19 NOTE — Progress Notes (Signed)
    SUBJECTIVE:   CHIEF COMPLAINT / HPI:   Repeat Pap: patient's previous pap on 03/26/2020 with our office was positive for HPV however was negative for transitional zone component.  Patient opted to have Pap repeated.  Other than having some vaginal itch/burn denies any other concerning signs or symptoms.  As patient's previous Pap smear 03/26/2020 was performed in our office however did not thoroughly assess transitional zone component this visit will be a "no charge" and patient should not be billed for services.  PERTINENT  PMH / PSH:  Patient Active Problem List   Diagnosis Date Noted  . Routine screening for STI (sexually transmitted infection) 04/20/2020  . Yeast infection 04/20/2020  . Encounter for screening for cervical cancer 03/29/2020  . Vaginal candidiasis 08/19/2019  . Dysuria 08/19/2019  . Screening for STDs (sexually transmitted diseases) 08/19/2019  . Generalized anxiety disorder 04/14/2019  . Tic disorder 04/14/2019  . Allergic rhinitis 02/02/2014  . Vaginal itching 02/02/2014  . Anemia 06/11/2012  . Overweight (BMI 25.0-29.9) 03/28/2011  . Bacterial vaginosis 11/22/2010  . CIN I (cervical intraepithelial neoplasia I) 11/22/2010     OBJECTIVE:   BP 112/78   Pulse (!) 56   Ht 5\' 10"  (1.778 m)   Wt 204 lb 9.6 oz (92.8 kg)   LMP 04/04/2020 (Exact Date)   SpO2 100%   BMI 29.36 kg/m    Physical exam: General: Well-appearing patient, no apparent distress Respiratory: Comfortable work of breathing on room air GU: Labia minora and majora without lesion, thick white vaginal discharge appreciated, normal-appearing vaginal rugated without lesion, cervix was well visualized and is without lesion, closed os without discharge   ASSESSMENT/PLAN:   Encounter for screening for cervical cancer Previous Pap smear 3/4 did not thoroughly assess transitional zone, patient opted for repeat Pap smear. -Pap repeated with good visualization of the cervix -We will follow-up  with results and treat as needed  Routine screening for STI (sexually transmitted infection) Patient screened for gonorrhea, chlamydia, HPV, and trichomonas at today's visit. -We will follow results and treat as needed  Yeast infection Patient's physical exam and symptoms of vaginal itch/burn concerning for a yeast infection. -Patient given Diflucan 150 mg, 2 tablets.  To be taken after patient has been treated for BV.  Bacterial vaginosis Patient's wet prep today positive for clue cells, positive whiff, and bacteria. -Patient sent prescription for Flagyl 500 mg twice daily x7 days -With concurrent yeast infection patient can take Diflucan after treatment with Flagyl     04/06/2020, DO [facility]

## 2020-04-20 ENCOUNTER — Ambulatory Visit (INDEPENDENT_AMBULATORY_CARE_PROVIDER_SITE_OTHER): Payer: Medicaid Other | Admitting: Family Medicine

## 2020-04-20 ENCOUNTER — Other Ambulatory Visit: Payer: Self-pay

## 2020-04-20 ENCOUNTER — Other Ambulatory Visit (HOSPITAL_COMMUNITY)
Admission: RE | Admit: 2020-04-20 | Discharge: 2020-04-20 | Disposition: A | Discharge status: Home / Self Care | Payer: Medicaid Other | Source: Ambulatory Visit | Attending: Family Medicine | Admitting: Family Medicine

## 2020-04-20 ENCOUNTER — Encounter: Payer: Self-pay | Admitting: Family Medicine

## 2020-04-20 VITALS — BP 112/78 | HR 56 | Ht 70.0 in | Wt 204.6 lb

## 2020-04-20 DIAGNOSIS — Z113 Encounter for screening for infections with a predominantly sexual mode of transmission: Secondary | ICD-10-CM | POA: Insufficient documentation

## 2020-04-20 DIAGNOSIS — Z124 Encounter for screening for malignant neoplasm of cervix: Secondary | ICD-10-CM

## 2020-04-20 DIAGNOSIS — B373 Candidiasis of vulva and vagina: Secondary | ICD-10-CM | POA: Diagnosis not present

## 2020-04-20 DIAGNOSIS — B379 Candidiasis, unspecified: Secondary | ICD-10-CM | POA: Insufficient documentation

## 2020-04-20 DIAGNOSIS — N760 Acute vaginitis: Secondary | ICD-10-CM

## 2020-04-20 DIAGNOSIS — B9689 Other specified bacterial agents as the cause of diseases classified elsewhere: Secondary | ICD-10-CM

## 2020-04-20 LAB — POCT WET PREP (WET MOUNT)
Clue Cells Wet Prep Whiff POC: POSITIVE
Trichomonas Wet Prep HPF POC: ABSENT

## 2020-04-20 MED ORDER — FLUCONAZOLE 150 MG PO TABS: 150.0000 mg | ORAL_TABLET | Freq: Every day | ORAL | 1 refills | Status: DC

## 2020-04-20 MED ORDER — METRONIDAZOLE 500 MG PO TABS: 500.0000 mg | ORAL_TABLET | Freq: Two times a day (BID) | ORAL | 0 refills | Status: AC

## 2020-04-20 NOTE — Assessment & Plan Note (Signed)
Patient screened for gonorrhea, chlamydia, HPV, and trichomonas at today's visit. -We will follow results and treat as needed

## 2020-04-20 NOTE — Assessment & Plan Note (Addendum)
Patient's physical exam and symptoms of vaginal itch/burn concerning for a yeast infection. -Patient given Diflucan 150 mg, 2 tablets.  To be taken after patient has been treated for BV.

## 2020-04-20 NOTE — Assessment & Plan Note (Signed)
Previous Pap smear 3/4 did not thoroughly assess transitional zone, patient opted for repeat Pap smear. -Pap repeated with good visualization of the cervix -We will follow-up with results and treat as needed

## 2020-04-20 NOTE — Assessment & Plan Note (Signed)
Patient's wet prep today positive for clue cells, positive whiff, and bacteria. -Patient sent prescription for Flagyl 500 mg twice daily x7 days -With concurrent yeast infection patient can take Diflucan after treatment with Flagyl

## 2020-04-22 LAB — CYTOLOGY - PAP
Chlamydia: NEGATIVE
Comment: NEGATIVE
Comment: NEGATIVE
Comment: NORMAL
Diagnosis: UNDETERMINED — AB
High risk HPV: NEGATIVE
Neisseria Gonorrhea: NEGATIVE

## 2020-04-23 ENCOUNTER — Encounter: Payer: Self-pay | Admitting: Family Medicine

## 2020-04-23 NOTE — Progress Notes (Signed)
Contact patient re results of pap. Given her previous pap on 3/4 showed HPV and current pap showed ASCUS. Precepted patient's results, believe patient would benefit from Colpo. Message sent to front office to call patient to schedule her for colpo.   Peggyann Shoals, DO [facility], PGY-3 04/23/2020 10:54 AM

## 2020-04-29 ENCOUNTER — Ambulatory Visit (INDEPENDENT_AMBULATORY_CARE_PROVIDER_SITE_OTHER): Payer: Self-pay | Admitting: Family Medicine

## 2020-04-29 ENCOUNTER — Other Ambulatory Visit: Payer: Self-pay

## 2020-04-29 DIAGNOSIS — R87810 Cervical high risk human papillomavirus (HPV) DNA test positive: Secondary | ICD-10-CM

## 2020-04-29 DIAGNOSIS — R87610 Atypical squamous cells of undetermined significance on cytologic smear of cervix (ASC-US): Secondary | ICD-10-CM

## 2020-04-29 NOTE — Progress Notes (Signed)
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  Placed in lithotomy position. Cervix viewed with speculum and colposcope after application of acetic acid.   Colposcopy adequate (entire squamocolumnar junctions seen  in entirety) ?  Yes Acetowhite lesions?  None Punctation?  None Mosaicism?  None Abnormal vasculature?  None Biopsies?  No ECC?  No Complications?  None  COMMENTS:  Patient was given post procedure instructions.  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  Repeat Pap in 1 year.

## 2020-04-29 NOTE — Patient Instructions (Signed)
Your colposcopy today was clinically normal.  We saw no abnormalities and we do not need to do any biopsies.  I would recommend you have a repeat Pap smear in 1 year.  Call us with questions.

## 2020-05-25 ENCOUNTER — Encounter: Payer: Self-pay | Admitting: Family Medicine

## 2020-05-25 ENCOUNTER — Ambulatory Visit (INDEPENDENT_AMBULATORY_CARE_PROVIDER_SITE_OTHER): Payer: Self-pay | Admitting: Family Medicine

## 2020-05-25 ENCOUNTER — Other Ambulatory Visit: Payer: Self-pay

## 2020-05-25 ENCOUNTER — Other Ambulatory Visit (HOSPITAL_COMMUNITY)
Admission: RE | Admit: 2020-05-25 | Discharge: 2020-05-25 | Disposition: A | Discharge status: Home / Self Care | Payer: Medicaid Other | Source: Ambulatory Visit | Attending: Family Medicine | Admitting: Family Medicine

## 2020-05-25 VITALS — BP 108/62 | HR 59 | Ht 70.0 in | Wt 199.8 lb

## 2020-05-25 DIAGNOSIS — F411 Generalized anxiety disorder: Secondary | ICD-10-CM

## 2020-05-25 DIAGNOSIS — R42 Dizziness and giddiness: Secondary | ICD-10-CM

## 2020-05-25 DIAGNOSIS — N761 Subacute and chronic vaginitis: Secondary | ICD-10-CM | POA: Insufficient documentation

## 2020-05-25 DIAGNOSIS — E663 Overweight: Secondary | ICD-10-CM

## 2020-05-25 LAB — POCT GLYCOSYLATED HEMOGLOBIN (HGB A1C): HbA1c, POC (controlled diabetic range): 5.9 % (ref 0.0–7.0)

## 2020-05-25 MED ORDER — BUSPIRONE HCL 5 MG PO TABS: 10.0000 mg | ORAL_TABLET | Freq: Two times a day (BID) | ORAL | 3 refills | Status: DC

## 2020-05-25 NOTE — Progress Notes (Signed)
    SUBJECTIVE:   CHIEF COMPLAINT / HPI:   Anxiety: Patient feels like she has been getting more anxiety than she has depression.  Patient was previously on BuSpar and felt like this helped with her ticsand her anxiety.  Anxiety is triggered by driving.  Feels like she "cannot breathe and is suffocating".  Patient recently lost her job at [employer] due to attendance issues.  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  Patient states she is still having her tics.  No verbal tics.  Feels like she constantly has to move.  She will get "weird thoughts" like "her head is going to explode" and this will cause her to move her head in a certain way.  Patient states that her vaginal irritation discharge improved after being treated with Flagyl and Diflucan at her last appointment.  States the Diflucan actually "made it worse".  PERTINENT  PMH / PSH: Anxiety, tic disorder  OBJECTIVE:   BP 108/62   Pulse (!) 59   Ht 5\' 10"  (1.778 m)   Wt 199 lb 12.8 oz (90.6 kg)   LMP 05/05/2020   SpO2 98%   BMI 28.67 kg/m   General: Alert and oriented.  No acute distress. CV: Regular rate and rhythm Pulmonary: Muscular strength bilaterally GU: Copious amounts of white vaginal discharge  ASSESSMENT/PLAN:   Generalized anxiety disorder Will restart BuSpar at 5 mg and increase to 10 mg twice daily in 1 week.  Should add SSRI in the future.  Discussed with patient today but will start with just BuSpar.  Subacute vaginitis Symptoms are improved after last treatment with Diflucan and Flagyl.  On exam still appears to have a lot of white discharge most consistent with yeast.  Getting testing for gonorrhea chlamydia, trichomonas, yeast, BV.     05/07/2020, MD [facility]

## 2020-05-25 NOTE — Assessment & Plan Note (Signed)
Symptoms are improved after last treatment with Diflucan and Flagyl.  On exam still appears to have a lot of white discharge most consistent with yeast.  Getting testing for gonorrhea chlamydia, trichomonas, yeast, BV.

## 2020-05-25 NOTE — Patient Instructions (Addendum)
It was nice to see you,  I had sent in a new prescription for your BuSpar for your anxiety.  It is 10 mg twice a day.  This will be 2 5 mg tablets twice a day.  You can start with the first week by taking one 5 mg tablet twice a day before increasing to 2 tablets twice a day.  I have also included below some lists of therapists.  I would start with family service of the [place].  I will get back to you with the results of your testing for your vaginal irritation.  If necessary we will prescribe new antifungal or antibacterial treatment.  Schedule an appointment with me in 3 to 4 weeks so we can discuss these issues and others.  Have a great day,  Frederic Jericho, MD  Outpatient Mental Health Providers (No Insurance at time of Visit or Self Pay)  [facility] [place] Mon-Fri, 8:30-5:00  [address] [place], [place] [address]    [phone] [email or web address]  [phone] (Immediate assistance)  [facility]    Walk-in Mon-Fri, 8am-3pm [address], [place], [place]  [phone]  [email or web address]  [facility] [place] (Habla Espanol) walk in M-F 8am-12pm and  1pm-3pm [place]- [address]     [phone]  [employer] [address]  Phone: [phone]  [facility] (Mental Health and substance challenges) [address]   [place] [phone]    [email or web address]    [facility]  [facility], [place]     Phone:  [phone] [facility]-  [address]  [phone]         Alcohol & Drug Services Walk-in MWF 12:30 to 3:00     [address] [place] [address]  [phone]  [email or web address] call to schedule an appointment    Mental Health [facility] Classes ,Support group, Peer support services, [address], [place], [place] [address] [phone]  PhotoSolver.pl           National Alliance on Mental Illness (NAMI) [place]- Wellness classes, Support groups         [address] [place], [place] [address] [phone]   [email or web address]  [facility]  (Psycho-social Rehabilitation clubhouse, Individual and group therapy) [address] [place], [place] [address]   [phone]  24- Hour Availability:  [facility] [phone] or [phone] * [facility] [place] [employer] (Domestic Violence, Rape, etc. )9472896633 Vesta Mixer [phone] or [phone] * [facility] [place] [facility] ([phone] only) [phone] (after hours) *Therapeutic Alternative Mobile Crisis Unit [phone] *[place] National Suicide Hotline (970)618-7364 Len Childs)

## 2020-05-25 NOTE — Assessment & Plan Note (Signed)
Will restart BuSpar at 5 mg and increase to 10 mg twice daily in 1 week.  Should add SSRI in the future.  Discussed with patient today but will start with just BuSpar.

## 2020-05-26 ENCOUNTER — Telehealth: Payer: Self-pay | Admitting: *Deleted

## 2020-05-26 ENCOUNTER — Other Ambulatory Visit: Payer: Self-pay | Admitting: Family Medicine

## 2020-05-26 DIAGNOSIS — D509 Iron deficiency anemia, unspecified: Secondary | ICD-10-CM

## 2020-05-26 LAB — LIPID PANEL
Chol/HDL Ratio: 2.5 ratio (ref 0.0–4.4)
Cholesterol, Total: 163 mg/dL (ref 100–199)
HDL: 66 mg/dL (ref >39)
LDL Chol Calc (NIH): 76 mg/dL (ref 0–99)
Triglycerides: 122 mg/dL (ref 0–149)
VLDL Cholesterol Cal: 21 mg/dL (ref 5–40)

## 2020-05-26 LAB — CBC
Hematocrit: 30.2 % — ABNORMAL LOW (ref 34.0–46.6)
Hemoglobin: 8.5 g/dL — ABNORMAL LOW (ref 11.1–15.9)
MCH: 18.8 pg — ABNORMAL LOW (ref 26.6–33.0)
MCHC: 28.1 g/dL — ABNORMAL LOW (ref 31.5–35.7)
MCV: 67 fL — ABNORMAL LOW (ref 79–97)
Platelets: 294 10*3/uL (ref 150–450)
RBC: 4.53 x10E6/uL (ref 3.77–5.28)
RDW: 17.6 % — ABNORMAL HIGH (ref 11.7–15.4)
WBC: 5.1 10*3/uL (ref 3.4–10.8)

## 2020-05-26 LAB — CERVICOVAGINAL ANCILLARY ONLY
Bacterial Vaginitis (gardnerella): POSITIVE — AB
Candida Glabrata: NEGATIVE
Candida Vaginitis: POSITIVE — AB
Chlamydia: NEGATIVE
Comment: NEGATIVE
Comment: NEGATIVE
Comment: NEGATIVE
Comment: NEGATIVE
Comment: NEGATIVE
Comment: NORMAL
Neisseria Gonorrhea: NEGATIVE
Trichomonas: NEGATIVE

## 2020-05-26 NOTE — Telephone Encounter (Signed)
Pt is requesting a call to discuss her CBC results she saw in mychart. Jone Baseman, CMA

## 2020-05-26 NOTE — Telephone Encounter (Signed)
Called pt to discuss results.  A1c 5.9 in prediabetic range. Advised low carbohydrate diet and weight loss. 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  Will get additional blood work.  Advised pt to come in for lab visit at her earliest convenience.

## 2020-05-27 ENCOUNTER — Other Ambulatory Visit: Payer: Self-pay | Admitting: Family Medicine

## 2020-05-27 MED ORDER — CLINDAMYCIN HCL 300 MG PO CAPS: 300.0000 mg | ORAL_CAPSULE | Freq: Two times a day (BID) | ORAL | 0 refills | Status: AC

## 2020-05-27 MED ORDER — TERCONAZOLE 0.4 % VA CREA: 1.0000 | TOPICAL_CREAM | Freq: Every day | VAGINAL | 1 refills | Status: AC

## 2020-05-27 NOTE — Progress Notes (Signed)
Yeast and BV infection resistant to previous treatment.  Will prescribe alt treatment: clinda BID x 7d and terconazole intravaginal x 7d.  Sending to [employer] at [employer].

## 2020-07-21 NOTE — Progress Notes (Signed)
    SUBJECTIVE:   CHIEF COMPLAINT / HPI:   Anxiety: Patient increased her BuSpar to 10 mg twice daily.  Says it is making her less anxious.  states she "is able to drive now".  Only side effect is that it makes her drowsy.  She does not want to start a new daily medication.  Prefers as needed medications.  Anemia: Patient here to get blood work for anemia.  States that she had previously taken iron in the past but gave her chest pain and constipation.  States that her mother was also anemic.  Patient denies any abnormal uterine bleeding.  Periods usually last 5 days with 1 to 2 days of heavy bleeding.  No black tarry stools or blood in the stools.  PERTINENT  PMH / PSH: Anemia, tic disorder, anxiety  OBJECTIVE:   BP 90/60   Pulse 71   Ht 5\' 10"  (1.778 m)   Wt 197 lb 4 oz (89.5 kg)   LMP 07/14/2020   SpO2 99%   BMI 28.30 kg/m   General: Alert and oriented.  No acute distress. Psych: Pleasant affect.  Makes eye contact.  Does not appear anxious.  Upper body motor tics present throughout encounter.  ASSESSMENT/PLAN:   Generalized anxiety disorder Currently taking BuSpar 10 mg twice a day.  States it is sedating but still would like to continue using it.  Does not want to try different medication or add a second medication at this time.  In the future would consider adding a SSRI or SNRI to help with tic disorder as well as anxiety.  Ideally would be able to taper her off the BuSpar at some point if we can transition to SSRI/SNRI.  Anemia Microcytic anemia.  No excessive uterine bleeding or GI bleeds.  Does not tolerate iron supplementation.  Has had low ferritin in the past.  Getting a iron panel and hemoglobinopathy.  Depending on these results may need to give patient IV iron infusion.  Advised patient she can take a multivitamin if she would like.     07/16/2020, MD [facility]

## 2020-07-22 ENCOUNTER — Other Ambulatory Visit: Payer: Self-pay

## 2020-07-22 ENCOUNTER — Ambulatory Visit (INDEPENDENT_AMBULATORY_CARE_PROVIDER_SITE_OTHER): Payer: Medicaid Other | Admitting: Family Medicine

## 2020-07-22 ENCOUNTER — Encounter: Payer: Self-pay | Admitting: Family Medicine

## 2020-07-22 DIAGNOSIS — F411 Generalized anxiety disorder: Secondary | ICD-10-CM

## 2020-07-22 DIAGNOSIS — D509 Iron deficiency anemia, unspecified: Secondary | ICD-10-CM

## 2020-07-22 NOTE — Patient Instructions (Signed)
It was nice to see you today,  We are getting blood work for the cause of your anemia.  We will follow-up with this when we get the results and treat as necessary.  For your anxiety, you can continue to use the BuSpar 10 mg twice a day.  At some point I would like to try a daily medication that may help your anxiety but also be less sedating.  Please schedule a follow-up with your new PCP in the next month.  Have a great day,  Frederic Jericho, MD

## 2020-07-22 NOTE — Assessment & Plan Note (Signed)
Currently taking BuSpar 10 mg twice a day.  States it is sedating but still would like to continue using it.  Does not want to try different medication or add a second medication at this time.  In the future would consider adding a SSRI or SNRI to help with tic disorder as well as anxiety.  Ideally would be able to taper her off the BuSpar at some point if we can transition to SSRI/SNRI.

## 2020-07-22 NOTE — Assessment & Plan Note (Signed)
Microcytic anemia.  No excessive uterine bleeding or GI bleeds.  Does not tolerate iron supplementation.  Has had low ferritin in the past.  Getting a iron panel and hemoglobinopathy.  Depending on these results may need to give patient IV iron infusion.  Advised patient she can take a multivitamin if she would like.

## 2020-07-23 LAB — IRON,TIBC AND FERRITIN PANEL
Ferritin: 2 ng/mL — ABNORMAL LOW (ref 15–150)
Iron Saturation: 4 % — CL (ref 15–55)
Iron: 14 ug/dL — ABNORMAL LOW (ref 27–159)
Total Iron Binding Capacity: 397 ug/dL (ref 250–450)
UIBC: 383 ug/dL (ref 131–425)

## 2021-06-28 ENCOUNTER — Encounter: Payer: Self-pay | Admitting: *Deleted

## 2021-09-14 ENCOUNTER — Encounter (HOSPITAL_BASED_OUTPATIENT_CLINIC_OR_DEPARTMENT_OTHER): Payer: Self-pay | Admitting: Emergency Medicine

## 2021-09-14 ENCOUNTER — Emergency Department (HOSPITAL_BASED_OUTPATIENT_CLINIC_OR_DEPARTMENT_OTHER)
Admission: EM | Admit: 2021-09-14 | Discharge: 2021-09-14 | Disposition: A | Discharge status: Home / Self Care | Payer: BC Managed Care – PPO | Attending: Emergency Medicine | Admitting: Emergency Medicine

## 2021-09-14 ENCOUNTER — Other Ambulatory Visit: Payer: Self-pay

## 2021-09-14 ENCOUNTER — Emergency Department (HOSPITAL_BASED_OUTPATIENT_CLINIC_OR_DEPARTMENT_OTHER): Payer: BC Managed Care – PPO

## 2021-09-14 DIAGNOSIS — S8012XA Contusion of left lower leg, initial encounter: Secondary | ICD-10-CM | POA: Insufficient documentation

## 2021-09-14 DIAGNOSIS — X58XXXA Exposure to other specified factors, initial encounter: Secondary | ICD-10-CM | POA: Diagnosis not present

## 2021-09-14 DIAGNOSIS — S8992XA Unspecified injury of left lower leg, initial encounter: Secondary | ICD-10-CM | POA: Diagnosis not present

## 2021-09-14 MED ORDER — ACETAMINOPHEN 500 MG PO TABS
1000.0000 mg | ORAL_TABLET | Freq: Once | ORAL | Status: AC
  Administered 2021-09-14: 1000 mg via ORAL
  Filled 2021-09-14: qty 2

## 2021-09-14 NOTE — Discharge Instructions (Signed)
Use ice to the area as discussed.  Try to keep it elevated as much as possible.  You can use ibuprofen or Tylenol for symptomatic relief.  You can also use Voltaren gel to the area to help with the discomfort and swelling.  Follow-up with your primary care doctor if your symptoms are not improving.  Return to the emergency room if you have any worsening symptoms.

## 2021-09-14 NOTE — ED Provider Notes (Signed)
[facility] EMERGENCY DEPT Provider Note   CSN: [id number] Arrival date & time: 09/14/21  0801     History  Chief Complaint  Patient presents with   Leg Pain    Elizabeth Hines is a 36 y.o. female.  Patient is a 36 year old female who presents with pain in her left leg.  She states that 5 days ago she was moving a TV and it being done the front of her lower leg.  She has been having pain since that time.  She woke up this morning and found that was hard to walk on it so she came in for evaluation.  She denies any other injuries.       Home Medications Prior to Admission medications   Medication Sig Start Date End Date Taking? Authorizing Provider  busPIRone (BUSPAR) 5 MG tablet Take 2 tablets (10 mg total) by mouth 2 (two) times daily. 05/25/20   Sandre Kitty, MD  fluconazole (DIFLUCAN) 150 MG tablet Take 1 tablet (150 mg total) by mouth daily. 04/20/20   Dollene Cleveland, DO  fluticasone (FLONASE) 50 MCG/ACT nasal spray Place 2 sprays into both nostrils daily. 11/28/17 11/29/18  Leland Her, DO  iron polysaccharides (FERREX 150) 150 MG capsule Take 1 capsule (150 mg total) by mouth daily. Patient not taking: Reported on 11/03/2016 02/10/15 11/29/18  Raliegh Ip, DO      Allergies    Ativan [lorazepam]    Review of Systems   Review of Systems  Constitutional:  Negative for fever.  Gastrointestinal:  Negative for nausea and vomiting.  Musculoskeletal:  Positive for arthralgias and joint swelling. Negative for back pain and neck pain.  Skin:  Positive for color change. Negative for wound.  Neurological:  Negative for weakness, numbness and headaches.    Physical Exam Updated Vital Signs BP 132/73 (BP Location: Right Arm)   Pulse 77   Temp 98.9 F (37.2 C) (Oral)   Resp 16   Ht 5\' 10"  (1.778 m)   Wt 92.1 kg   LMP 08/31/2021 (Exact Date)   SpO2 99%   BMI 29.13 kg/m  Physical Exam Constitutional:      Appearance: She is well-developed.  HENT:      Head: Normocephalic and atraumatic.  Cardiovascular:     Rate and Rhythm: Normal rate.  Pulmonary:     Effort: Pulmonary effort is normal.  Musculoskeletal:        General: Tenderness present.     Cervical back: Normal range of motion and neck supple.     Comments: Positive tenderness to the lower tibia.  There is a small amount of ecchymosis to this area.  Mild swelling.  There is no pain to the ankle joint.  Pedal pulses are intact.  No open wounds.  No pain to the foot or knee.  Skin:    General: Skin is warm and dry.  Neurological:     Mental Status: She is alert and oriented to person, place, and time.     ED Results / Procedures / Treatments   Labs (all labs ordered are listed, but only abnormal results are displayed) Labs Reviewed - No data to display  EKG None  Radiology DG Tibia/Fibula Left  Result Date: 09/14/2021 CLINICAL DATA:  Hit  lower leg on TV. EXAM: LEFT TIBIA AND FIBULA - 2 VIEW COMPARISON:  None Available. FINDINGS: No fracture of the tibia or fibula. Knee joint and ankle joint appear normal on two views. Tibial plateau incompletely  imaged. IMPRESSION: No fracture or dislocation. Electronically Signed   By: Genevive Bi M.D.   On: 09/14/2021 08:42    Procedures Procedures    Medications Ordered in ED Medications  acetaminophen (TYLENOL) tablet 1,000 mg (1,000 mg Oral Given 09/14/21 7989)    ED Course/ Medical Decision Making/ A&P                           Medical Decision Making Amount and/or Complexity of Data Reviewed Independent Historian:     Details: Significant other External Data Reviewed: notes.    Details: To obtain prior history Radiology: ordered and independent interpretation performed. Decision-making details documented in ED Course.  Risk OTC drugs.   Patient is a 36 year old female who presents with some bruising and tenderness to her left lower leg over the pretibial area.  There is no joint pain to the ankle.  Perfusion  is intact distally.  No signs of infection.  X-rays were obtained which were interpreted by me and confirmed by the radiologist to show no fracture.  She was instructed in symptomatic care.  Return precautions were given.  Final Clinical Impression(s) / ED Diagnoses Final diagnoses:  Contusion of left lower extremity, initial encounter    Rx / DC Orders ED Discharge Orders     None         Rolan Bucco, MD 09/14/21 [phone]

## 2021-09-14 NOTE — ED Notes (Signed)
Patient verbalizes understanding of discharge instructions. Opportunity for questioning and answers were provided. Patient discharged from ED.  °

## 2021-09-14 NOTE — ED Triage Notes (Signed)
Pt arrives to ED with c/o left leg pain x5 days.

## 2022-04-13 ENCOUNTER — Ambulatory Visit (HOSPITAL_COMMUNITY)
Admission: RE | Admit: 2022-04-13 | Discharge: 2022-04-13 | Disposition: A | Discharge status: Home / Self Care | Payer: BC Managed Care – PPO | Source: Ambulatory Visit | Attending: Family Medicine | Admitting: Family Medicine

## 2022-04-13 ENCOUNTER — Ambulatory Visit (INDEPENDENT_AMBULATORY_CARE_PROVIDER_SITE_OTHER): Payer: BC Managed Care – PPO | Admitting: Family Medicine

## 2022-04-13 VITALS — BP 112/64 | HR 77 | Ht 70.0 in | Wt 200.8 lb

## 2022-04-13 DIAGNOSIS — F411 Generalized anxiety disorder: Secondary | ICD-10-CM

## 2022-04-13 DIAGNOSIS — D509 Iron deficiency anemia, unspecified: Secondary | ICD-10-CM | POA: Diagnosis not present

## 2022-04-13 DIAGNOSIS — R7303 Prediabetes: Secondary | ICD-10-CM | POA: Insufficient documentation

## 2022-04-13 DIAGNOSIS — I498 Other specified cardiac arrhythmias: Secondary | ICD-10-CM | POA: Insufficient documentation

## 2022-04-13 DIAGNOSIS — N926 Irregular menstruation, unspecified: Secondary | ICD-10-CM | POA: Diagnosis not present

## 2022-04-13 DIAGNOSIS — R87810 Cervical high risk human papillomavirus (HPV) DNA test positive: Secondary | ICD-10-CM

## 2022-04-13 DIAGNOSIS — R87610 Atypical squamous cells of undetermined significance on cytologic smear of cervix (ASC-US): Secondary | ICD-10-CM

## 2022-04-13 LAB — POCT URINE PREGNANCY: Preg Test, Ur: NEGATIVE

## 2022-04-13 NOTE — Assessment & Plan Note (Signed)
Repeat A1c today.  Food choices for prediabetes handout given.

## 2022-04-13 NOTE — Progress Notes (Signed)
    SUBJECTIVE:   CHIEF COMPLAINT / HPI:  Chief Complaint  Patient presents with   Breast Problem    Warm, fluttering sensation.     Patient has had episodic fluttering sensation in her chest for the past month. Doesn't think it's from her heart, more from her breast. Occurs randomly, last episode earlier today at work. Not happening every day. Lasts for 3-5 minutes. Does feel anxious when this happens. Denies pain.  No breast abnormalities or lumps. She is not taking the buspirone regularly.  Used to see a therapist but not seeing one currently.  She is interested in reestablishing with a therapist.  Also overdue on her menstrual period, wants pregnancy test.  LMP 2/16.  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  Lives by herself.  PERTINENT  PMH / PSH: anxiety, iron deficiency anemia (not taking iron supplement currently)  Patient Care Team: Zola Button, MD as PCP - General (Family Medicine)   OBJECTIVE:   BP 112/64   Pulse 77   Ht 5\' 10"  (1.778 m)   Wt 200 lb 12.8 oz (91.1 kg)   LMP 03/10/2022   SpO2 99%   BMI 28.81 kg/m   Physical Exam Constitutional:      General: She is not in acute distress.    Appearance: Normal appearance.  Cardiovascular:     Rate and Rhythm: Normal rate and regular rhythm.  Pulmonary:     Effort: Pulmonary effort is normal. No respiratory distress.     Breath sounds: Normal breath sounds.  Musculoskeletal:     Cervical back: Neck supple.  Neurological:     Mental Status: She is alert.         04/13/2022    2:10 PM  Depression screen PHQ 2/9  Decreased Interest 2  Down, Depressed, Hopeless 2  PHQ - 2 Score 4  Altered sleeping 2  Tired, decreased energy 3  Change in appetite 1  Feeling bad or failure about yourself  1  Trouble concentrating 2  Moving slowly or fidgety/restless 3  Suicidal thoughts 0  PHQ-9 Score 16  Difficult doing work/chores Somewhat difficult     {Show previous vital signs (optional):23777}    ASSESSMENT/PLAN:    Problem List Items Addressed This Visit       Other   Anemia    Repeat CBC and ferritin given history of iron deficiency anemia      Relevant Orders   CBC   Ferritin   Generalized anxiety disorder    Episodic fluttering sensation in her chest is most likely secondary to anxiety given history. - EKG obtained in NSR - therapy resources provided - continue buspirone      ASCUS with positive high risk HPV cervical    Overdue for repeat Pap smear, will plan to obtain this at follow-up      Prediabetes    Repeat A1c today.  Food choices for prediabetes handout given.      Relevant Orders   Hemoglobin A1c   Other Visit Diagnoses     Fluttering heart    -  Primary   Relevant Orders   EKG 12-Lead   Late period       Relevant Orders   POCT urine pregnancy (Completed)       Pregnancy test negative.  Declines contraception at this time.  Contraception resources provided.   Return in about 4 weeks (around 05/11/2022) for physical, pap smear.   Zola Button, MD [place]

## 2022-04-13 NOTE — Assessment & Plan Note (Signed)
Episodic fluttering sensation in her chest is most likely secondary to anxiety given history. - EKG obtained in NSR - therapy resources provided - continue buspirone

## 2022-04-13 NOTE — Patient Instructions (Addendum)
It was nice seeing you today!  [email or web address] for birth control options.  Therapy and Counseling Resources Most providers on this list will take Medicaid. Patients with commercial insurance or Medicare should contact their insurance company to get a list of in network providers.  [employer] [employer] (takes children) Location 1: [address] [place], [place] [address] Location 2: [place], [place] [address] [place] ([place] speaking therapist available)(habla espanol)(take medicare and medicaid)  [place], [place], [place] [address], [place] [email or web address] [phone]  [facility]:Psychiatry and Counseling [address] [place], [place] [address] [place], [place], [place] [address]      [phone]  [facility] (spanish available) [place], [place] [address] [place] (take [facility] and medicare) [address], [place],  [address]       [phone]     [place] (virtual only) [phone]  [facility] Bl[facility] [address] [place], [place]  [facility]:  [place]. [place] [phone]  [facility]:  [place] [address] [phone]  [facility] (under & uninsured) [address] [place] [phone]    [email or web address]    [place] [address]  [place]    [phone]  [facility] [place]     Phone:  [phone]     [place]  [place] [address]      [place], [place] ext [place]: [place], [place], [place]   [place] ([place]  therapist) [email or web address]  [place] [address]   [place] [address]    [phone]    [facility]   [address],  [place], [place]   [place]  [place]  [facility]  [address] [place], [place]        [phone]  Open Access/Walk In Clinic under & uninsured  [facility]  [address] [place], [place] Crisis [phone]  [facility] [place],  ([place])   [place], [place]: [phone]) 8:30 - 12; 1 - 2:30  [facility] the [place],  [place], [place]    ([phone]):8:30 - 12; 2 - 3PM  [facility] [employer],  [address],  [place]; [phone]):   Mon - Fri 8 AM - 5 PM  [facility] [place]  MWF 12:30 to 3:00 or call to schedule an appointment  [phone]  Specific Provider options Psychology Today  [email or web address] click on find a therapist  enter your zip code left side and select or tailor a therapist for your specific need.   [facility] Provider Directory [email or web address]  (Medicaid)   Follow all drop down to find a provider  [place] or [email or web address] [address], [place], [place] Recovery support and educational   24- Hour Availability:   [facility]  [address] [place], [place] Crisis  [phone]  [facility] the [facility] [place]   [place]  [phone] (after hours)  Therapeutic Alternative/Mobile Crisis   [phone]  [place] National Suicide Hotline  [phone] Diamantina Monks)  Call 911 or go to emergency room  [facility]  [phone]);  [place] and [employer]  [phone]); Lilla Shook, [place], [place], [place], [place], [place]   Stay well, Zola Button, MD [place] [phone]  --  Make sure to check out at the front desk before you leave today.  Please arrive at least 15 minutes prior to your scheduled appointments.  If you had blood work today, I will send you a MyChart message or a letter if results are normal. Otherwise, I will give you a call.  If you had a referral placed, they will call you to set up an appointment. Please give [place] a call if you don't hear back in the next 2 weeks.  If you need additional refills before your next appointment, please call your pharmacy first.

## 2022-04-13 NOTE — Assessment & Plan Note (Signed)
Repeat CBC and ferritin given history of iron deficiency anemia

## 2022-04-13 NOTE — Assessment & Plan Note (Signed)
Overdue for repeat Pap smear, will plan to obtain this at follow-up

## 2022-04-14 LAB — CBC
Hematocrit: 29.5 % — ABNORMAL LOW (ref 34.0–46.6)
Hemoglobin: 8.1 g/dL — ABNORMAL LOW (ref 11.1–15.9)
MCH: 18.2 pg — ABNORMAL LOW (ref 26.6–33.0)
MCHC: 27.5 g/dL — ABNORMAL LOW (ref 31.5–35.7)
MCV: 66 fL — ABNORMAL LOW (ref 79–97)
Platelets: 301 10*3/uL (ref 150–450)
RBC: 4.45 x10E6/uL (ref 3.77–5.28)
RDW: 17.2 % — ABNORMAL HIGH (ref 11.7–15.4)
WBC: 5.8 10*3/uL (ref 3.4–10.8)

## 2022-04-14 LAB — HEMOGLOBIN A1C
Est. average glucose Bld gHb Est-mCnc: 123 mg/dL
Hgb A1c MFr Bld: 5.9 % — ABNORMAL HIGH (ref 4.8–5.6)

## 2022-04-14 LAB — FERRITIN: Ferritin: 3 ng/mL — ABNORMAL LOW (ref 15–150)

## 2022-04-14 MED ORDER — FERROUS SULFATE 325 (65 FE) MG PO TABS: 325.0000 mg | ORAL_TABLET | ORAL | 3 refills | Status: DC

## 2022-04-14 NOTE — Addendum Note (Signed)
Addended by: Zola Button D on: 04/14/2022 09:03 AM   Modules accepted: Orders

## 2022-04-28 ENCOUNTER — Ambulatory Visit: Payer: BC Managed Care – PPO | Admitting: Family Medicine

## 2022-05-19 ENCOUNTER — Encounter: Payer: Self-pay | Admitting: Family Medicine

## 2022-05-19 ENCOUNTER — Ambulatory Visit (INDEPENDENT_AMBULATORY_CARE_PROVIDER_SITE_OTHER): Payer: BC Managed Care – PPO | Admitting: Family Medicine

## 2022-05-19 ENCOUNTER — Other Ambulatory Visit (HOSPITAL_COMMUNITY)
Admission: RE | Admit: 2022-05-19 | Discharge: 2022-05-19 | Disposition: A | Discharge status: Home / Self Care | Payer: BC Managed Care – PPO | Source: Ambulatory Visit | Attending: Family Medicine | Admitting: Family Medicine

## 2022-05-19 VITALS — BP 123/64 | HR 72 | Ht 70.0 in | Wt 203.0 lb

## 2022-05-19 DIAGNOSIS — R87810 Cervical high risk human papillomavirus (HPV) DNA test positive: Secondary | ICD-10-CM

## 2022-05-19 DIAGNOSIS — R7303 Prediabetes: Secondary | ICD-10-CM

## 2022-05-19 DIAGNOSIS — Z23 Encounter for immunization: Secondary | ICD-10-CM

## 2022-05-19 DIAGNOSIS — R87610 Atypical squamous cells of undetermined significance on cytologic smear of cervix (ASC-US): Secondary | ICD-10-CM

## 2022-05-19 NOTE — Progress Notes (Signed)
    SUBJECTIVE:   CHIEF COMPLAINT / HPI:  Chief Complaint  Patient presents with  . Gynecologic Exam    ***  PERTINENT  PMH / PSH: ***  Patient Care Team: Littie Deeds, MD as PCP - General (Family Medicine)   OBJECTIVE:   BP 123/64   Pulse 72   Ht 5\' 10"  (1.778 m)   Wt 203 lb (92.1 kg)   LMP 04/22/2022   SpO2 100%   BMI 29.13 kg/m   Physical Exam      05/19/2022   11:44 AM  Depression screen PHQ 2/9  Decreased Interest 2  Down, Depressed, Hopeless 1  PHQ - 2 Score 3  Altered sleeping 1  Tired, decreased energy 1  Change in appetite 0  Feeling bad or failure about yourself  0  Trouble concentrating 1  Moving slowly or fidgety/restless 2  Suicidal thoughts 0  PHQ-9 Score 8     Wt Readings from Last 3 Encounters:  05/19/22 203 lb (92.1 kg)  04/13/22 200 lb 12.8 oz (91.1 kg)  09/14/21 203 lb (92.1 kg)      {Labs  Heme  Chem  Endocrine  Serology  Results Review (optional):23779}  ASSESSMENT/PLAN:   No problem-specific Assessment & Plan notes found for this encounter.    No follow-ups on file.   Littie Deeds, MD [facility]

## 2022-05-19 NOTE — Patient Instructions (Addendum)
It was nice seeing you today!  I will let you know about pap smear results.  Stay well, Littie Deeds, MD [facility] [phone]  --  Make sure to check out at the front desk before you leave today.  Please arrive at least 15 minutes prior to your scheduled appointments.  If you had blood work today, I will send you a MyChart message or a letter if results are normal. Otherwise, I will give you a call.  If you had a referral placed, they will call you to set up an appointment. Please give [place] a call if you don't hear back in the next 2 weeks.  If you need additional refills before your next appointment, please call your pharmacy first.

## 2022-05-20 NOTE — Assessment & Plan Note (Signed)
Repeat Pap smear performed today of clinically indicated, next steps pending results.

## 2022-05-20 NOTE — Assessment & Plan Note (Signed)
Counseled on food choices for prediabetes, handout provided.

## 2022-05-24 LAB — CYTOLOGY - PAP
Chlamydia: NEGATIVE
Comment: NEGATIVE
Comment: NEGATIVE
Comment: NEGATIVE
Comment: NORMAL
Diagnosis: UNDETERMINED — AB
High risk HPV: POSITIVE — AB
Neisseria Gonorrhea: NEGATIVE
Trichomonas: NEGATIVE

## 2022-05-25 ENCOUNTER — Telehealth: Payer: Self-pay | Admitting: Family Medicine

## 2022-05-25 DIAGNOSIS — N760 Acute vaginitis: Secondary | ICD-10-CM

## 2022-05-25 DIAGNOSIS — R87610 Atypical squamous cells of undetermined significance on cytologic smear of cervix (ASC-US): Secondary | ICD-10-CM

## 2022-05-25 MED ORDER — METRONIDAZOLE 0.75 % VA GEL
1.0000 | Freq: Every day | VAGINAL | 0 refills | Status: AC
Start: 2022-05-25 — End: 2022-05-30

## 2022-05-25 NOTE — Telephone Encounter (Signed)
Called patient to discuss Pap smear results.  Pap smear again shows ASCUS with high risk HPV.  Per ASCCP guidelines, recommendation to have colposcopy.  I will reach out to the admin team to have her scheduled for colposcopy.  Patient also requesting treatment for recurrent BV.  She did have clinical signs of BV at most recent visit but did not test for BV.  Given history we will go ahead and send prescription for metronidazole vaginal gel.

## 2022-06-08 ENCOUNTER — Ambulatory Visit (INDEPENDENT_AMBULATORY_CARE_PROVIDER_SITE_OTHER): Payer: BC Managed Care – PPO | Admitting: Family Medicine

## 2022-06-08 VITALS — BP 110/74 | HR 71 | Ht 70.0 in | Wt 203.0 lb

## 2022-06-08 DIAGNOSIS — R87610 Atypical squamous cells of undetermined significance on cytologic smear of cervix (ASC-US): Secondary | ICD-10-CM

## 2022-06-08 DIAGNOSIS — R87810 Cervical high risk human papillomavirus (HPV) DNA test positive: Secondary | ICD-10-CM | POA: Diagnosis not present

## 2022-06-08 LAB — POCT URINE PREGNANCY: Preg Test, Ur: NEGATIVE

## 2022-06-08 NOTE — Progress Notes (Signed)
Patient given informed consent, signed copy in the chart.  Placed in lithotomy position. Cervix viewed with speculum and colposcope after application of acetic acid.   Colposcopy adequate (entire squamocolumnar junctions seen  in entirety) ?  yes Acetowhite lesions?none Punctation?none Mosaicism?  none Abnormal vasculature?  none Biopsies?none ECC?no Complications? none  COMMENTS: Patient was given post procedure instructions. Recommend repeat pap in one year.

## 2022-06-08 NOTE — Patient Instructions (Signed)
It was great seeing you today!  Your cervix looked normal! Return in 1 year for a repeat pap smear.   Feel free to call with any questions or concerns at any time, at [phone].   Take care,  Dr. Cora Collum [facility]

## 2022-08-26 ENCOUNTER — Emergency Department
Admission: EM | Admit: 2022-08-26 | Discharge: 2022-08-26 | Disposition: A | Discharge status: Home / Self Care | Payer: BC Managed Care – PPO | Attending: Emergency Medicine | Admitting: Emergency Medicine

## 2022-08-26 ENCOUNTER — Other Ambulatory Visit: Payer: Self-pay

## 2022-08-26 DIAGNOSIS — F419 Anxiety disorder, unspecified: Secondary | ICD-10-CM | POA: Insufficient documentation

## 2022-08-26 DIAGNOSIS — F411 Generalized anxiety disorder: Secondary | ICD-10-CM

## 2022-08-26 HISTORY — DX: Anxiety disorder, unspecified: F41.9

## 2022-08-26 MED ORDER — BUSPIRONE HCL 5 MG PO TABS
10.0000 mg | ORAL_TABLET | Freq: Two times a day (BID) | ORAL | 1 refills | Status: DC
Start: 2022-08-26 — End: 2022-09-12

## 2022-08-26 NOTE — ED Triage Notes (Signed)
Patient states she was siting on the couch and started feeling anxious and having palpitations; history of the same. Previously was on Buspirone but was taken off by doctor 2 years ago.

## 2022-08-26 NOTE — ED Provider Notes (Signed)
[facility] Provider Note    Event Date/Time   First MD Initiated Contact with Patient 08/26/22 1318     (approximate)   History   Anxiety   HPI  Elizabeth Hines is a 37 y.o. female with history of anxiety presents emergency department stating she had anxiety attack earlier today.  States she been doing really well for a while.  Used to be on BuSpar but had stopped medication.  States now she think she needs to make on the medication but she does not want to feel this way.  Denies any chest pain or shortness of breath.      Physical Exam   Triage Vital Signs: ED Triage Vitals  Encounter Vitals Group     BP 08/26/22 1250 125/77     Systolic BP Percentile --      Diastolic BP Percentile --      Pulse Rate 08/26/22 1250 (!) 115     Resp 08/26/22 1250 20     Temp 08/26/22 1250 98.1 F (36.7 C)     Temp Source 08/26/22 1250 Oral     SpO2 08/26/22 1250 100 %     Weight 08/26/22 1251 198 lb (89.8 kg)     Height 08/26/22 1251 5\' 10"  (1.778 m)     Head Circumference --      Peak Flow --      Pain Score 08/26/22 1251 0     Pain Loc --      Pain Education --      Exclude from Growth Chart --     Most recent vital signs: Vitals:   08/26/22 1250  BP: 125/77  Pulse: (!) 115  Resp: 20  Temp: 98.1 F (36.7 C)  SpO2: 100%     General: Awake, no distress.   CV:  Good peripheral perfusion. regular rate and  rhythm Resp:  Normal effort. Lungs cta Abd:  No distention.   Other:     ED Results / Procedures / Treatments   Labs (all labs ordered are listed, but only abnormal results are displayed) Labs Reviewed - No data to display   EKG     RADIOLOGY     PROCEDURES:   Procedures   MEDICATIONS ORDERED IN ED: Medications - No data to display   IMPRESSION / MDM / ASSESSMENT AND PLAN / ED COURSE  I reviewed the triage vital signs and the nursing notes.                              Differential diagnosis includes, but is not  limited to, anxiety, palpitations, panic attack  Patient's presentation is most consistent with exacerbation of chronic illness.    This is an Presenter, broadcasting of her anxiety as she has not been on medication.  Will give her a prescription for her BuSpar.  She is to follow-up with her regular doctor.  She denies any SI/HI.  Feel she is stable for discharge.  She is to follow-up with her regular doctor if not improving to 3 days.  Return emergency department worsening.  Discharged stable condition.      FINAL CLINICAL IMPRESSION(S) / ED DIAGNOSES   Final diagnoses:  Anxiety     Rx / DC Orders   ED Discharge Orders          Ordered    busPIRone (BUSPAR) 5 MG tablet  2 times daily  08/26/22 1437             Note:  This document was prepared using Dragon voice recognition software and may include unintentional dictation errors.    Faythe Ghee, PA-C 08/26/22 1439    Loleta Rose, MD 08/26/22 [phone]

## 2022-09-12 ENCOUNTER — Ambulatory Visit (INDEPENDENT_AMBULATORY_CARE_PROVIDER_SITE_OTHER): Payer: Self-pay | Admitting: Family Medicine

## 2022-09-12 VITALS — BP 100/60 | HR 53 | Ht 70.0 in | Wt 200.0 lb

## 2022-09-12 DIAGNOSIS — F411 Generalized anxiety disorder: Secondary | ICD-10-CM

## 2022-09-12 MED ORDER — BUSPIRONE HCL 5 MG PO TABS
5.0000 mg | ORAL_TABLET | Freq: Two times a day (BID) | ORAL | Status: DC
Start: 2022-09-12 — End: 2022-10-06

## 2022-09-12 NOTE — Assessment & Plan Note (Addendum)
Patient was prescribed BuSpar 10 mg BID [place] at the beginning of this month. GAD-7: 18 (severe anxiety) and PHQ-9: 16 (moderately severe) no SI/HI. - Discussed that since she has been off of the medication for quite some time maybe titrating the medicine up would be in her best interest. - Advised patient to take BuSpar 5 mg BID - Scheduled follow up with her PCP on 9/13 to see if her symptoms are improving or if up titration is needed - Letter provided for work

## 2022-09-12 NOTE — Progress Notes (Signed)
    SUBJECTIVE:  PCP: Lorayne Bender, MD  CHIEF COMPLAINT / HPI: anxiety  Patient presents to the clinic today to discuss her anxiety.  She reports that she had an anxiety attack on August 3 and had to go to [place].  She was previously on BuSpar 10 mg twice daily, however stopped taking it in 2022.  Patient was restarted on BuSpar 10 mg twice daily, but was nervous if she should start at such a high dose and the side effects that might have since she has not been on it in a while.  She relates her anxiety may need to bills and describes it as generalized.  She shares that in the past the medications have helped, along with walking and breathing techniques.  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 This has improved since coming to the office today.  She comes in to discuss how to take her anxiety medications.  PERTINENT  PMH / PSH: anxiety on Buspar, prediabetes, anemia  OBJECTIVE:   BP 100/60   Pulse (!) 53   Ht 5\' 10"  (1.778 m)   Wt 200 lb (90.7 kg)   LMP 08/31/2022   SpO2 98%   BMI 28.70 kg/m   General: NAD, awake and alert HEENT: Normocephalic, atraumatic. Conjunctiva normal. No nasal discharge. Neck: Supple, non-tender, no adenopathy, masses, or thyromegaly. Cardiovascular: RRR. No M/R/G Respiratory: CTAB, normal WOB on RA. No wheezing, crackles, rhonchi, or diminished breath sounds. Abdomen: Soft, non-tender, non-distended. Bowel sounds normoactive Extremities: No BLE edema, no deformities or significant joint findings.  Skin: Warm and dry. Neuro: A&Ox3. No focal neurological deficits.   ASSESSMENT/PLAN:   Generalized anxiety disorder Patient was prescribed BuSpar 10 mg BID [place] at the beginning of this month. GAD-7: 18 (severe anxiety) and PHQ-9: 16 (moderately severe) no SI/HI. - Discussed that since she has been off of the medication for quite some time maybe  titrating the medicine up would be in her best interest. - Advised patient to take BuSpar 5 mg BID - Scheduled follow up with her PCP on 9/13 to see if her symptoms are improving or if up titration is needed - Letter provided for work   Fortunato Curling, DO [facility]

## 2022-09-12 NOTE — Patient Instructions (Addendum)
It was great to see you today! Thank you for choosing [facility] for your primary care. Elizabeth Hines was seen for anxiety.  Today we addressed: Taking your Buspar to help your anxiety, but starting it at 5 mg twice a day, a total of 10 mg per day.  Therapy, breathing techniques, and walking can be considered as those are modalities that have helped you cope with your anxiety in the past. Below are a list of outpatient mental health providers for you to consider.  You should return to our clinic Return in about 3 weeks (around 10/03/2022) for anxiety follow up.  Please arrive 15 minutes before your appointment to ensure smooth check in process.  We appreciate your efforts in making this happen.  Thank you for allowing me to participate in your care, Fortunato Curling, DO 09/12/2022, 11:43 AM PGY-1, [facility]   Outpatient Mental Health Providers (No Insurance required or Self Pay)  [facility]  [phone] [email or web address] Marcy Panning and [facility]  [address] [place], [place] [phone] Crisis [phone]  [facility]) can see uninsured folks for outpatient therapy [email or web address] [address] [place], [place] [address] [phone]  [facility]    Walk-in Mon-Fri, 8am-3pm [email or web address] Scull [address] [place], [place]  [phone]   [address]  [place] [phone] [facility] for psych med management, there may be a wait- if [facility] is working with clients for OPT, they will coordinate with [facility] for psych  [facility]   Walk-in-Clinic: Monday- Friday 9:00 AM - 4:00 PM [address]   [place], [place] [phone]  [facility] [place] ([employer]) walk in M-F 8am-12pm and  1pm-3pm [place]- [address]     [phone]  [employer]  -[address]  Phone: [phone]  [employer] (Mental Health and substance challenges) [address]   [place] [phone]    [email or web address]    [address]  [employer] [facility]  [facility] -[address], [place]     Phone:  [phone] [facility]-  [address]  [phone]    [facility] ( virtual or zoom therapy) [email or web address]  [address] [place]  [phone]    [facility] [phone]  grief counseling, dementia and caregiver support    [facility] Walk-in MWF 12:30 to 3:00     [address] [place] [address]  [phone]  [email or web address] call to schedule an appointment     National Alliance on Mental Illness (NAMI) [place]- Wellness classes, Support groups        [address], [place], [place] [address] [phone]   [email or web address]   [facility]  (Psycho-social Rehabilitation clubhouse, Individual and group therapy) [address] [place], [place] [address]   [phone]  24- Hour Availability:  [facility] [phone] or [phone] * [facility] [employer] (Domestic Violence, Rape, etc. )[phone] Vesta Mixer [phone] or [phone] * [facility] [phone] only) [phone] (after hours) *[facility] Crisis [facility] [phone] *[place] National Suicide Hotline [phone] Len Childs)

## 2022-09-18 ENCOUNTER — Ambulatory Visit (HOSPITAL_COMMUNITY): Payer: Self-pay | Admitting: Licensed Clinical Social Worker

## 2022-09-18 ENCOUNTER — Encounter (HOSPITAL_COMMUNITY): Payer: Self-pay

## 2022-09-18 DIAGNOSIS — F331 Major depressive disorder, recurrent, moderate: Secondary | ICD-10-CM | POA: Insufficient documentation

## 2022-09-18 DIAGNOSIS — F411 Generalized anxiety disorder: Secondary | ICD-10-CM
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
## 2022-09-22 ENCOUNTER — Encounter (HOSPITAL_COMMUNITY): Payer: Self-pay

## 2022-09-22 ENCOUNTER — Emergency Department (HOSPITAL_COMMUNITY)
Admission: EM | Admit: 2022-09-22 | Discharge: 2022-09-22 | Disposition: A | Discharge status: Home / Self Care | Payer: Self-pay | Attending: Emergency Medicine | Admitting: Emergency Medicine

## 2022-09-22 ENCOUNTER — Other Ambulatory Visit: Payer: Self-pay

## 2022-09-22 ENCOUNTER — Ambulatory Visit (HOSPITAL_COMMUNITY)
Admission: EM | Admit: 2022-09-22 | Discharge: 2022-09-22 | Disposition: A | Discharge status: Other Acute Inpatient Hospital | Payer: No Payment, Other | Attending: Psychiatry | Admitting: Psychiatry

## 2022-09-22 DIAGNOSIS — F410 Panic disorder [episodic paroxysmal anxiety] without agoraphobia: Secondary | ICD-10-CM | POA: Insufficient documentation

## 2022-09-22 DIAGNOSIS — F411 Generalized anxiety disorder: Secondary | ICD-10-CM | POA: Insufficient documentation

## 2022-09-22 DIAGNOSIS — D72829 Elevated white blood cell count, unspecified: Secondary | ICD-10-CM | POA: Insufficient documentation

## 2022-09-22 DIAGNOSIS — Z79899 Other long term (current) drug therapy: Secondary | ICD-10-CM | POA: Insufficient documentation

## 2022-09-22 DIAGNOSIS — Z1152 Encounter for screening for COVID-19: Secondary | ICD-10-CM | POA: Insufficient documentation

## 2022-09-22 DIAGNOSIS — N3000 Acute cystitis without hematuria: Secondary | ICD-10-CM | POA: Insufficient documentation

## 2022-09-22 DIAGNOSIS — R9431 Abnormal electrocardiogram [ECG] [EKG]: Secondary | ICD-10-CM | POA: Insufficient documentation

## 2022-09-22 LAB — COMPREHENSIVE METABOLIC PANEL
ALT: 15 U/L (ref 0–44)
AST: 19 U/L (ref 15–41)
Albumin: 4.1 g/dL (ref 3.5–5.0)
Alkaline Phosphatase: 108 U/L (ref 38–126)
Anion gap: 13 (ref 5–15)
BUN: 7 mg/dL (ref 6–20)
CO2: 15 mmol/L — ABNORMAL LOW (ref 22–32)
Calcium: 9.3 mg/dL (ref 8.9–10.3)
Chloride: 105 mmol/L (ref 98–111)
Creatinine, Ser: 0.81 mg/dL (ref 0.44–1.00)
GFR, Estimated: >60 mL/min (ref >60)
Glucose, Bld: 119 mg/dL — ABNORMAL HIGH (ref 70–99)
Potassium: 3 mmol/L — ABNORMAL LOW (ref 3.5–5.1)
Sodium: 133 mmol/L — ABNORMAL LOW (ref 135–145)
Total Bilirubin: 1 mg/dL (ref 0.3–1.2)
Total Protein: 8.1 g/dL (ref 6.5–8.1)

## 2022-09-22 LAB — ETHANOL: Alcohol, Ethyl (B): <10 mg/dL (ref <10)

## 2022-09-22 LAB — RESP PANEL BY RT-PCR (RSV, FLU A&B, COVID)  RVPGX2
Influenza A by PCR: NEGATIVE
Influenza B by PCR: NEGATIVE
Resp Syncytial Virus by PCR: NEGATIVE
SARS Coronavirus 2 by RT PCR: NEGATIVE

## 2022-09-22 LAB — URINALYSIS, ROUTINE W REFLEX MICROSCOPIC
Bilirubin Urine: NEGATIVE
Glucose, UA: NEGATIVE mg/dL
Hgb urine dipstick: NEGATIVE
Ketones, ur: 20 mg/dL — AB
Nitrite: POSITIVE — AB
Protein, ur: 100 mg/dL — AB
Specific Gravity, Urine: 1.021 (ref 1.005–1.030)
pH: 6 (ref 5.0–8.0)

## 2022-09-22 LAB — CBC WITH DIFFERENTIAL/PLATELET
Abs Immature Granulocytes: 0.04 10*3/uL (ref 0.00–0.07)
Basophils Absolute: 0 10*3/uL (ref 0.0–0.1)
Basophils Relative: 0 %
Eosinophils Absolute: 0 10*3/uL (ref 0.0–0.5)
Eosinophils Relative: 0 %
HCT: 33.1 % — ABNORMAL LOW (ref 36.0–46.0)
Hemoglobin: 9.9 g/dL — ABNORMAL LOW (ref 12.0–15.0)
Immature Granulocytes: 0 %
Lymphocytes Relative: 17 %
Lymphs Abs: 1.8 10*3/uL (ref 0.7–4.0)
MCH: 20.4 pg — ABNORMAL LOW (ref 26.0–34.0)
MCHC: 29.9 g/dL — ABNORMAL LOW (ref 30.0–36.0)
MCV: 68.1 fL — ABNORMAL LOW (ref 80.0–100.0)
Monocytes Absolute: 1 10*3/uL (ref 0.1–1.0)
Monocytes Relative: 9 %
Neutro Abs: 7.8 10*3/uL — ABNORMAL HIGH (ref 1.7–7.7)
Neutrophils Relative %: 74 %
Platelets: 278 10*3/uL (ref 150–400)
RBC: 4.86 MIL/uL (ref 3.87–5.11)
RDW: 19.2 % — ABNORMAL HIGH (ref 11.5–15.5)
Smear Review: NORMAL
WBC: 10.6 10*3/uL — ABNORMAL HIGH (ref 4.0–10.5)
nRBC: 0 % (ref 0.0–0.2)

## 2022-09-22 LAB — RAPID URINE DRUG SCREEN, HOSP PERFORMED
Amphetamines: NOT DETECTED
Barbiturates: NOT DETECTED
Benzodiazepines: NOT DETECTED
Cocaine: NOT DETECTED
Opiates: NOT DETECTED
Tetrahydrocannabinol: NOT DETECTED

## 2022-09-22 LAB — SALICYLATE LEVEL: Salicylate Lvl: <7 mg/dL — ABNORMAL LOW (ref 7.0–30.0)

## 2022-09-22 LAB — ACETAMINOPHEN LEVEL: Acetaminophen (Tylenol), Serum: <10 ug/mL — ABNORMAL LOW (ref 10–30)

## 2022-09-22 MED ORDER — MAGNESIUM HYDROXIDE 400 MG/5ML PO SUSP: 30.0000 mL | Freq: Every day | ORAL | Status: DC | PRN

## 2022-09-22 MED ORDER — HYDROXYZINE HCL 25 MG PO TABS: 25.0000 mg | ORAL_TABLET | Freq: Three times a day (TID) | ORAL | Status: DC | PRN

## 2022-09-22 MED ORDER — CEPHALEXIN 250 MG PO CAPS: 500.0000 mg | ORAL_CAPSULE | Freq: Once | ORAL | Status: DC

## 2022-09-22 MED ORDER — ACETAMINOPHEN 325 MG PO TABS
650.0000 mg | ORAL_TABLET | Freq: Once | ORAL | Status: AC
  Administered 2022-09-22: 650 mg via ORAL
  Filled 2022-09-22: qty 2

## 2022-09-22 MED ORDER — ACETAMINOPHEN 325 MG PO TABS: 650.0000 mg | ORAL_TABLET | Freq: Four times a day (QID) | ORAL | Status: DC | PRN

## 2022-09-22 MED ORDER — CEPHALEXIN 500 MG PO CAPS: 500.0000 mg | ORAL_CAPSULE | Freq: Four times a day (QID) | ORAL | 0 refills | Status: AC

## 2022-09-22 MED ORDER — POTASSIUM CHLORIDE CRYS ER 20 MEQ PO TBCR
40.0000 meq | EXTENDED_RELEASE_TABLET | Freq: Once | ORAL | Status: AC
  Administered 2022-09-22: 40 meq via ORAL
  Filled 2022-09-22 (×2): qty 2

## 2022-09-22 MED ORDER — TRAZODONE HCL 50 MG PO TABS: 50.0000 mg | ORAL_TABLET | Freq: Every evening | ORAL | Status: DC | PRN

## 2022-09-22 MED ORDER — ALUM & MAG HYDROXIDE-SIMETH 200-200-20 MG/5ML PO SUSP: 30.0000 mL | ORAL | Status: DC | PRN

## 2022-09-22 NOTE — Discharge Instructions (Addendum)
You have a UTI.  Antibiotics sent into the pharmacy.  First dose received in the emergency department.  We discussed further workup of your anxiety at the behavioral urgent care but you state you want to go home at this point.  If you have any worsening symptoms he will return for evaluation.

## 2022-09-22 NOTE — ED Notes (Signed)
Patient discharge via ambulatory with a steady gait with [facility] staff member. Respirations equal and unlabored, skin warm and dry. No acute distress noted.Belongings returned from locker # 27 intact and given to [employer].

## 2022-09-22 NOTE — BH Assessment (Addendum)
Comprehensive Clinical Assessment (CCA) Screening, Triage and Referral Note  09/22/2022 Elizabeth Hines [id number]  Disposition: Per Doran Heater NP, patient is recommended for overnight observation.  The patient demonstrates the following risk factors for suicide: Chronic risk factors for suicide include: psychiatric disorder of anxiety . Acute risk factors for suicide include: N/A. Protective factors for this patient include: hope for the future. Considering these factors, the overall suicide risk at this point appears to be low. Patient is appropriate for outpatient follow up.  Chief Complaint:  Chief Complaint  Patient presents with   Anxiety   Panic Attack   Visit Diagnosis: Generalized anxiety disorder   Patient Reported Information How did you hear about [place]? Self  What Is the Reason for Your Visit/Call Today? Elizabeth Hines is a 37 year old female presenting to [facility] with EMS due to having a panic attack that lasted about 20 minutes. Pt reports that her heart was racing, she couldn't catch her breath and could not calm down. Pt reports having intrusive thoughts that she is having a heart attack. Pt reports she is always anxious and jumpy, feeling like she can't sit down. Pt reports this has been going on since she was a teen. Pt met with a licensed counselor at her church yesterday. Pt denies SI, HI, AVH. Pt denies alcohol and drug use.  Patient is oriented x4, engaged, alert and cooperative during assessment. Patient eye contact and speech is normal. Patient presents anxious with congruent mood. Patient is somewhat guarded about "intrusive thoughts" she was having. Patient denies SI, HI, AVH.       How Long Has This Been Causing You Problems? > than 6 months  What Do You Feel Would Help You the Most Today? Treatment for Depression or other mood problem   Have You Recently Had Any Thoughts About Hurting Yourself? No  Are You Planning to Commit Suicide/Harm Yourself At This time?  No   Have you Recently Had Thoughts About Hurting Someone Elizabeth Hines? No  Are You Planning to Harm Someone at This Time? No  Explanation: NA   Have You Used Any Alcohol or Drugs in the Past 24 Hours? No  How Long Ago Did You Use Drugs or Alcohol? NA What Did You Use and How Much? NA   Do You Currently Have a Therapist/Psychiatrist? Yes  Name of Therapist/Psychiatrist: PT SEES A THERAPIST AT HER [facility]   Have You Been Recently Discharged From Any Office Practice or Programs? No  Explanation of Discharge From Practice/Program: NA    CCA Screening Triage Referral Assessment Type of Contact: Face-to-Face  Telemedicine Service Delivery:   Is this Initial or Reassessment?   Date Telepsych consult ordered in [facility]:    Time Telepsych consult ordered in [facility]:    Location of Assessment: [facility]  Provider Location: [facility]    Collateral Involvement: NONE   Does Patient Have a Automotive engineer Guardian? NA Name and Contact of Legal Guardian: NA If Minor and Not Living with Parent(s), Who has Custody? NA  Is CPS involved or ever been involved? Never  Is APS involved or ever been involved? Never   Patient Determined To Be At Risk for Harm To Self or Others Based on Review of Patient Reported Information or Presenting Complaint? No  Method: No Plan  Availability of Means: No access or NA  Intent: Vague intent or NA  Notification Required: No need or identified person  Additional Information for Danger to Others Potential:  NA Additional Comments for Danger to Others Potential: NA  Are There Guns or Other Weapons in Your Home? No  Types of Guns/Weapons: NA  Are These Weapons Safely Secured?                            No  Who Could Verify You Are Able To Have These Secured: NA  Do You Have any Outstanding Charges, Pending Court Dates, Parole/Probation? DENIES  Contacted To Inform of Risk of Harm To Self or Others:  Family/Significant Other:   Does Patient Present under Involuntary Commitment? No    [place] of Residence: [place]   Patient Currently Receiving the Following Services: Individual Therapy; Medication Management   Determination of Need: Routine (7 days)   Options For Referral: Medication Management; Outpatient Therapy   Discharge Disposition:     Audree Camel, [facility]

## 2022-09-22 NOTE — ED Notes (Signed)
Patient discharged in the care of EMS to be transported to [facility]. Report given to [facility] by Michail Sermon, RN. Safety maintain.

## 2022-09-22 NOTE — ED Provider Notes (Cosign Needed Addendum)
[place] EMERGENCY DEPARTMENT AT [facility] Provider Note   CSN: [id number] Arrival date & time: 09/22/22  1757     History  Chief Complaint  Patient presents with   Anxiety    From [facility]    Elizabeth Hines is a 37 y.o. female.  37 year old female presents today for concern of anxiety.  She was seen at behavioral health urgent care and sent to the emergency department for medical clearance.  She states her anxiety attack started around 1:30 PM.  States this has happened in the past as well.  She attributes this to her anemia.  She states she takes iron supplement but has not taken it today.  She states she was also told from the behavioral urgent care that her anemia may be the cause.  She takes buspirone twice daily with which she is compliant with.  She denies SI, HI, or AVH.  She denies cough, dysuria, or other infectious symptoms.  The history is provided by the patient. No language interpreter was used.       Home Medications Prior to Admission medications   Medication Sig Start Date End Date Taking? Authorizing Provider  busPIRone (BUSPAR) 5 MG tablet Take 1 tablet (5 mg total) by mouth 2 (two) times daily. 09/12/22  Yes Fortunato Curling, DO  ferrous sulfate 325 (65 FE) MG tablet Take 1 tablet (325 mg total) by mouth every other day. 04/14/22  Yes Littie Deeds, MD  fluticasone [facility]) 50 MCG/ACT nasal spray Place 2 sprays into both nostrils daily. 11/28/17 11/29/18  Leland Her, DO  iron polysaccharides (FERREX 150) 150 MG capsule Take 1 capsule (150 mg total) by mouth daily. Patient not taking: Reported on 11/03/2016 02/10/15 11/29/18  Raliegh Ip, DO      Allergies    Ativan [lorazepam]    Review of Systems   Review of Systems  Constitutional:  Negative for chills and fever.  Respiratory:  Negative for shortness of breath.   Gastrointestinal:  Negative for abdominal pain, nausea and vomiting.  Neurological:  Negative for light-headedness.   Psychiatric/Behavioral:  Negative for sleep disturbance and suicidal ideas. The patient is nervous/anxious.   All other systems reviewed and are negative.   Physical Exam Updated Vital Signs BP (!) 130/91 (BP Location: Right Arm)   Pulse 100   Temp (!) 100.6 F (38.1 C) (Oral)   Resp 20   Ht 5\' 10"  (1.778 m)   Wt 90.7 kg   LMP 08/31/2022   SpO2 100%   BMI 28.70 kg/m  Physical Exam Vitals and nursing note reviewed.  Constitutional:      General: She is not in acute distress.    Appearance: Normal appearance. She is not ill-appearing.  HENT:     Head: Normocephalic and atraumatic.     Nose: Nose normal.  Eyes:     General: No scleral icterus.    Extraocular Movements: Extraocular movements intact.     Conjunctiva/sclera: Conjunctivae normal.  Cardiovascular:     Rate and Rhythm: Normal rate and regular rhythm.     Heart sounds: Normal heart sounds.  Pulmonary:     Effort: Pulmonary effort is normal. No respiratory distress.     Breath sounds: Normal breath sounds. No wheezing or rales.  Abdominal:     General: There is no distension.     Tenderness: There is no abdominal tenderness. There is no right CVA tenderness, left CVA tenderness or guarding.  Musculoskeletal:  General: Normal range of motion.     Cervical back: Normal range of motion.  Skin:    General: Skin is warm and dry.  Neurological:     General: No focal deficit present.     Mental Status: She is alert. Mental status is at baseline.     ED Results / Procedures / Treatments   Labs (all labs ordered are listed, but only abnormal results are displayed) Labs Reviewed  ACETAMINOPHEN LEVEL - Abnormal; Notable for the following components:      Result Value   Acetaminophen (Tylenol), Serum <10 (*)    All other components within normal limits  CBC WITH DIFFERENTIAL/PLATELET - Abnormal; Notable for the following components:   WBC 10.6 (*)    Hemoglobin 9.9 (*)    HCT 33.1 (*)    MCV 68.1 (*)     MCH 20.4 (*)    MCHC 29.9 (*)    RDW 19.2 (*)    Neutro Abs 7.8 (*)    All other components within normal limits  COMPREHENSIVE METABOLIC PANEL - Abnormal; Notable for the following components:   Sodium 133 (*)    Potassium 3.0 (*)    CO2 15 (*)    Glucose, Bld 119 (*)    All other components within normal limits  SALICYLATE LEVEL - Abnormal; Notable for the following components:   Salicylate Lvl <7.0 (*)    All other components within normal limits  RESP PANEL BY RT-PCR (RSV, FLU A&B, COVID)  RVPGX2  ETHANOL  RAPID URINE DRUG SCREEN, HOSP PERFORMED  URINALYSIS, ROUTINE W REFLEX MICROSCOPIC    EKG None  Radiology No results found.  Procedures Procedures    Medications Ordered in ED Medications  potassium chloride SA (KLOR-CON M) CR tablet 40 mEq (40 mEq Oral Given 09/22/22 2201)  acetaminophen (TYLENOL) tablet 650 mg (650 mg Oral Given 09/22/22 2200)    ED Course/ Medical Decision Making/ A&P                                 Medical Decision Making Amount and/or Complexity of Data Reviewed Labs: ordered.  Risk OTC drugs. Prescription drug management.   37 year old female presents from behavioral health urgent care for medical clearance.  She presented for anxiety attack that started at 130.  She denies SI, HI, or AVH.  Psych labs obtained.  Shows potassium of 3.0.  P.o. repletion given.  No other acute concerns on CMP.  UDS, respiratory panel negative.  Salicylate, acetaminophen, ethanol level within normal.  CBC shows mild leukocytosis, and anemia with hemoglobin at 9.9.  This appears to be above her usual baseline.  While in the emergency department she developed fever at 100.6.  UA was obtained which shows evidence of UTI.  No flank pain.  Urine culture sent.  Keflex started.  Initial dose given in the emergency department.  After the workup patient was given the option of returning to behavioral urgent care for further eval but she states that she would like to be  discharged as she does not want any additional workup and she feels a lot better.  Her mom is at bedside and she is some agreement. Return precautions discussed.  They voiced understanding and are in agreement with plan.   Final Clinical Impression(s) / ED Diagnoses Final diagnoses:  Anxiety attack  Acute cystitis without hematuria    Rx / DC Orders ED Discharge Orders  Ordered    cephALEXin (KEFLEX) 500 MG capsule  4 times daily        09/22/22 2233              Marita Kansas, PA-C 09/22/22 2239    Marita Kansas, PA-C 09/22/22 2240    Lonell Grandchild, MD 09/24/22 [phone]

## 2022-09-22 NOTE — ED Triage Notes (Signed)
PT BIB EMS for anxiety from [facility].  Patient has had anxiety since 1300.  Patient did not take her 2nd dose of anxiety medication today.   120/90 HR 72 99 RA

## 2022-09-22 NOTE — Discharge Instructions (Addendum)
Patient is instructed prior to discharge to:  Take all medications as prescribed by his/her mental healthcare provider. Report any adverse effects and or reactions from the medicines to his/her outpatient provider promptly. Keep all scheduled appointments, to ensure that you are getting refills on time and to avoid any interruption in your medication.  If you are unable to keep an appointment call to reschedule.  Be sure to follow-up with resources and follow-up appointments provided.  Patient has been instructed & cautioned: To not engage in alcohol and or illegal drug use while on prescription medicines. In the event of worsening symptoms, patient is instructed to call the crisis hotline, 911 and or go to the nearest ED for appropriate evaluation and treatment of symptoms. To follow-up with his/her primary care provider for your other medical issues, concerns and or health care needs.  Information: -National Suicide Prevention Lifeline 1-800-SUICIDE or 236 578 2298.  -988 offers 24/7 access to trained crisis counselors who can help people experiencing mental health-related distress. People can call or text 988 or chat 988lifeline.org for themselves or if they are worried about a loved one who may need crisis support.     Please contact one of the following facilities to start medication management and therapy services:   [facility]. ([place])  [place], [place]  [address] Phone: [phone]  [facility]  [address], [place] [address] Phone: [phone]  [facility]  [address]  [place], [place] [address]  [facility]  [address]  [place], [place] [address] Phone: [phone]

## 2022-09-22 NOTE — ED Provider Notes (Signed)
[facility] Urgent Care Continuous Assessment Admission H&P  Date: 09/22/22 Patient Name: Elizabeth Hines MRN: [id number] Chief Complaint: Anxiety  Diagnoses:  Final diagnoses:  Generalized anxiety disorder    HPI: Patient presents voluntarily to [facility] behavioral health, transported by EMS.  Patient reports calling 911 while attempting to exercise earlier when she became overwhelmingly anxious and felt that she could not breathe effectively.  Chart reviewed and patient discussed with Dr. Nelly Rout on 09/22/2022.  Patient is assessed by this nurse practitioner face-to-face.  On evaluation she is seated in assessment area, no apparent distress.  She is alert and oriented, pleasant and cooperative during assessment.  She presents with anxious mood, congruent affect.  Patient states "once I start feeling better and calm down is like nothing is wrong, I feel better now."  Patient reports increased anxiety symptoms x 1 month.  She missed 3 days of work this week and 1 day last week related to her anxiety.  Anxiety triggered primarily by work.  Patient works on an [employer] stage [employer] and feels that she is overly stressed when at work.  Additional stressors include finances, currently her rent is due.  Elizabeth Hines has been prescribed buspirone 5 mg twice daily by primary care provider 2 weeks ago.  Approximately 8 years ago she was stable on buspirone 10 mg twice daily for several years.  After feeling symptoms of anxiety completely resolved she stopped buspirone.  Reviewed increasing buspirone dose from 5 mg twice daily to 10 mg twice daily, patient declines.  She would like to follow-up with primary care provider before increasing this medication.  Patient is followed by outpatient counseling at [facility] behavioral health outpatient clinic.  She is also meeting with a counselor through her church.  She endorses history of 1 previous inpatient psychiatric hospitalization related to anxiety.  No family  mental health or addiction history reported.  Patient denies suicidal and homicidal ideations.  She denies history of suicide attempts, denies history of nonsuicidal self-harm.  She denies auditory and visual hallucinations.  There is no evidence of delusional thought content and no indication that patient is responding to internal stimuli.  Elizabeth Hines resides alone in [place].  She denies access to weapons.  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  She endorses average sleep and appetite.  Patient offered support and encouragement.  She gives verbal consent to speak with her mother, Elizabeth Hines phone number [phone]. Spoke with patient's mother who arrived to pick up patient. While walking toward lobby patient reports dizziness and shortness of breath.  Patient with history including anemia.  Patient remains voluntary.  Reviewed plan to include continuous observation unit admission.  Discussed medications including trazodone and hydroxyzine, reviewed potential side effects and offered patient opportunity to ask questions.  She agrees with plan for emergency department medical clearance prior to continuous observation admission at [facility] health for treatment and stabilization.  Chart reviewed and patient discussed with Dr. Nelly Rout on 09/22/2022.   Total Time spent with patient: 1 hour  Musculoskeletal  Strength & Muscle Tone: within normal limits Gait & Station: normal Patient leans: N/A  Psychiatric Specialty Exam  Presentation General Appearance:  Appropriate for Environment; Casual  Eye Contact: Good  Speech: Clear and Coherent; Normal Rate  Speech Volume: Normal  Handedness: Right   Mood and Affect  Mood: Anxious  Affect: Congruent   Thought Process  Thought Processes: Coherent; Goal Directed; Linear  Descriptions of Associations:Intact  Orientation:Full (Time,  Place and Person)  Thought  Content:WDL; Logical  Diagnosis of Schizophrenia or Schizoaffective disorder in past: No   Hallucinations:Hallucinations: None  Ideas of Reference:None  Suicidal Thoughts:Suicidal Thoughts: No  Homicidal Thoughts:Homicidal Thoughts: No   Sensorium  Memory: Immediate Good; Recent Good  Judgment: Good  Insight: Fair   Executive Functions  Concentration: Good  Attention Span: Good  Recall: Good  Fund of Knowledge: Good  Language: Good   Psychomotor Activity  Psychomotor Activity: Psychomotor Activity: Normal   Assets  Assets: Communication Skills; Desire for Improvement; Financial Resources/Insurance; Housing; Leisure Time; Physical Health; Resilience; Social Support   Sleep  Sleep: Sleep: Fair   No data recorded  Physical Exam Vitals and nursing note reviewed.  Constitutional:      Appearance: Normal appearance. She is well-developed.  HENT:     Head: Normocephalic and atraumatic.     Nose: Nose normal.  Cardiovascular:     Rate and Rhythm: Normal rate.  Pulmonary:     Effort: Pulmonary effort is normal.  Musculoskeletal:        General: Normal range of motion.     Cervical back: Normal range of motion.  Skin:    General: Skin is warm and dry.  Neurological:     Mental Status: She is alert and oriented to person, place, and time.  Psychiatric:        Attention and Perception: Attention and perception normal.        Mood and Affect: Affect normal. Mood is anxious.        Speech: Speech normal.        Behavior: Behavior normal. Behavior is cooperative.        Thought Content: Thought content normal.        Cognition and Memory: Cognition and memory normal.    Review of Systems  Constitutional: Negative.   HENT: Negative.    Eyes: Negative.   Respiratory: Negative.    Cardiovascular: Negative.   Gastrointestinal: Negative.   Genitourinary: Negative.   Musculoskeletal: Negative.   Skin: Negative.   Neurological: Negative.    Psychiatric/Behavioral:  The patient is nervous/anxious.     Blood pressure 129/83, pulse 91, temperature 99.5 F (37.5 C), temperature source Oral, resp. rate 19, last menstrual period 08/31/2022, SpO2 100%. There is no height or weight on file to calculate BMI.  Past Psychiatric History: Generalized anxiety disorder, tic disorder, major depressive disorder, recurrent  Is the patient at risk to self? No  Has the patient been a risk to self in the past 6 months? No .    Has the patient been a risk to self within the distant past? No   Is the patient a risk to others? No   Has the patient been a risk to others in the past 6 months? No   Has the patient been a risk to others within the distant past? No   Past Medical History:   Family History: None reported  Social History: Resides alone, employed, denies substance use  Last Labs:  Office Visit on 06/08/2022  Component Date Value Ref Range Status   Preg Test, Ur 06/08/2022 Negative  Negative Final  Office Visit on 05/19/2022  Component Date Value Ref Range Status   High risk HPV 05/19/2022 Positive (A)   Final   Neisseria Gonorrhea 05/19/2022 Negative   Final   Chlamydia 05/19/2022 Negative   Final   Trichomonas 05/19/2022 Negative   Final   Adequacy 05/19/2022 Satisfactory for evaluation; transformation zone component PRESENT.  Final   Diagnosis 05/19/2022 - Atypical squamous cells of undetermined significance (ASC-US) (A)   Final   Microorganisms 05/19/2022 Fungal organisms present consistent with Candida spp.   Final   Comment 05/19/2022 Normal Reference Range Trichomonas - Negative   Final   Comment 05/19/2022 Normal Reference Range HPV - Negative   Final   Comment 05/19/2022 Normal Reference Ranger Chlamydia - Negative   Final   Comment 05/19/2022 Normal Reference Range Neisseria Gonorrhea - Negative   Final  Office Visit on 04/13/2022  Component Date Value Ref Range Status   Preg Test, Ur 04/13/2022 Negative  Negative  Final   WBC 04/13/2022 5.8  3.4 - 10.8 x10E3/uL Final   RBC 04/13/2022 4.45  3.77 - 5.28 x10E6/uL Final   Hemoglobin 04/13/2022 8.1 (L)  11.1 - 15.9 g/dL Final   Hematocrit 24/40/1027 29.5 (L)  34.0 - 46.6 % Final   MCV 04/13/2022 66 (L)  79 - 97 fL Final   MCH 04/13/2022 18.2 (L)  26.6 - 33.0 pg Final   MCHC 04/13/2022 27.5 (L)  31.5 - 35.7 g/dL Final   RDW 25/36/6440 17.2 (H)  11.7 - 15.4 % Final   Platelets 04/13/2022 301  150 - 450 x10E3/uL Final   Ferritin 04/13/2022 3 (L)  15 - 150 ng/mL Final   Hgb A1c MFr Bld 04/13/2022 5.9 (H)  4.8 - 5.6 % Final   Comment:          Prediabetes: 5.7 - 6.4          Diabetes: >6.4          Glycemic control for adults with diabetes: <7.0    Est. average glucose Bld gHb Est-m* 04/13/2022 123  mg/dL Final    Allergies: Ativan [lorazepam]  Medications:  PTA Medications  Medication Sig   ferrous sulfate 325 (65 FE) MG tablet Take 1 tablet (325 mg total) by mouth every other day.   busPIRone (BUSPAR) 5 MG tablet Take 1 tablet (5 mg total) by mouth 2 (two) times daily.      Medical Decision Making  Patient remains voluntary.  She will be admitted to [facility] behavioral health continuous observation unit for treatment and stabilization.  Laboratory studies ordered including CBC, CMP, ethanol, magnesium and TSH.  Urine pregnancy, urine drug screen ordered.  EKG order initiated.   Current medications: -Acetaminophen 650 mg every 6 as needed/mild pain -Maalox 30 mL oral every 4 as needed/digestion -Hydroxyzine 25 mg 3 times daily as needed/anxiety -Magnesium hydroxide 30 mL daily as needed/mild constipation -Trazodone 50 mg nightly as needed/sleep    Recommendations  Based on my evaluation the patient appears to have an emergency medical condition for which I recommend the patient be transferred to the emergency department for further evaluation.  Patient accepted to [facility] emergency department by Dr. Gaylyn Cheers for medical  clearance.  Patient reports shortness of breath and brief episode of dizziness.  Patient is appropriate to return to [facility] behavioral health once medically cleared.  Lenard Lance, FNP 09/22/22  4:46 PM

## 2022-09-22 NOTE — Progress Notes (Signed)
   09/22/22 1504  [facility] Triage Screening (Walk-ins at [facility] only)  How Did You Hear About [place]? Self  What Is the Reason for Your Visit/Call Today? [name] is a 37 year old female presenting to [facility] with EMS due to having a panic attack that lasted about 20 minutes. Pt reports that her heart was racing, she couldn't catch her breath and could not calm down. Pt reports having intrusive thoughts that she is having a heart attack. Pt reports she is always anxious and jumpy, feeling like she can't sit down. Pt reports this has been going on since she was a teen. Pt met with a licensed counselor at her church yesterday. Pt denies SI, HI, AVH. Pt denies alcohol and drug use.  How Long Has This Been Causing You Problems? > than 6 months  Have You Recently Had Any Thoughts About Hurting Yourself? No  Are You Planning to Commit Suicide/Harm Yourself At This time? No  Have you Recently Had Thoughts About Hurting Someone [name]? No  Are You Planning To Harm Someone At This Time? No  Are you currently experiencing any auditory, visual or other hallucinations? No  Have You Used Any Alcohol or Drugs in the Past 24 Hours? No  Do you have any current medical co-morbidities that require immediate attention? No  Clinician description of patient physical appearance/behavior: anxious  What Do You Feel Would Help You the Most Today? Treatment for Depression or other mood problem  If access to [facility] Urgent Care was not available, would you have sought care in the Emergency Department? No  Determination of Need Routine (7 days)  Options For Referral Medication Management;Outpatient Therapy

## 2022-09-25 LAB — URINE CULTURE

## 2022-09-29 ENCOUNTER — Emergency Department (HOSPITAL_BASED_OUTPATIENT_CLINIC_OR_DEPARTMENT_OTHER)
Admission: EM | Admit: 2022-09-29 | Discharge: 2022-09-30 | Disposition: A | Discharge status: Home / Self Care | Payer: Self-pay | Attending: Emergency Medicine | Admitting: Emergency Medicine

## 2022-09-29 ENCOUNTER — Encounter (HOSPITAL_BASED_OUTPATIENT_CLINIC_OR_DEPARTMENT_OTHER): Payer: Self-pay | Admitting: Emergency Medicine

## 2022-09-29 DIAGNOSIS — F419 Anxiety disorder, unspecified: Secondary | ICD-10-CM | POA: Insufficient documentation

## 2022-09-29 NOTE — ED Provider Notes (Signed)
[facility] Provider Note  CSN: [id number] Arrival date & time: 09/29/22 2227  Chief Complaint(s) Anxiety  HPI Elizabeth Hines is a 37 y.o. female with past medical history as below, significant for anxiety, anemia, paresthesias UE who presents to the ED with complaint of anxiety  She reports earlier this evening that she began to feel very anxious, started having a panic attack.  She took her BuSpar which did not really alleviate her symptoms.  Worse than prior panic attacks.  She called EMS twice, refused transport the first time and was agreeable to transport the second time.  On my assessment reports that she is feeling better.  Still has some anxiety but is improved from prior.  No chest pain or palpitations, no nausea or vomiting.  No fever chills, no illicit drug or recent alcohol use.    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   Per Colpo               11/06/07 - CIN I              2010 - Negative              11/03/2010 - Negative for Lesions                PAP Hx:               08/15/07 - ASC-US + High Risk HPV              09/06/2010 - LSIL              11/03/2010 - ASC-US + High Risk HPV              11/07/2011 - NORMAL - REPEAT IN 1 year              12/02/12 - Absent Tran Zone - REPEAT indicated              12/30/2012   Palpitations 12/19/2011   Paresthesias of bilateral upper limbs, worse on right 04/09/2012   Trichomonas    10/14/07; 11/25/07; 06/01/08   Vaginal discharge 10/31/2010   Patient Active Problem List   Diagnosis Date Noted   Major depressive disorder, recurrent episode, moderate (HCC) 09/18/2022   Prediabetes 04/13/2022   ASCUS with positive high risk HPV cervical 04/29/2020   Generalized anxiety disorder 04/14/2019   Tic disorder 04/14/2019   Allergic rhinitis 02/02/2014    Anemia 06/11/2012   Overweight (BMI 25.0-29.9) 03/28/2011   Home Medication(s) Prior to Admission medications   Medication Sig Start Date End Date Taking? Authorizing Provider  hydrOXYzine (ATARAX) 25 MG tablet Take 1 tablet (25 mg total) by mouth at bedtime as needed for anxiety. 09/30/22  Yes Tanda Rockers A, DO  busPIRone (BUSPAR) 5 MG tablet Take 1 tablet (5 mg total) by mouth 2 (two) times daily. 09/12/22   Fortunato Curling, DO  ferrous sulfate 325 (65 FE) MG tablet Take 1 tablet (325 mg total) by mouth every other day. 04/14/22   Littie Deeds, MD  fluticasone (FLONASE) 50 MCG/ACT nasal spray Place 2 sprays into both nostrils daily. 11/28/17 11/29/18  Leland Her, DO  iron polysaccharides (FERREX 150) 150 MG capsule Take 1 capsule (150 mg total) by mouth daily. Patient not taking: Reported on 11/03/2016 02/10/15 11/29/18  Raliegh Ip, DO                                                                                                                                    Past Surgical History History reviewed. No pertinent surgical history. Family History Family History  Problem Relation Age of Onset   Healthy Mother    Healthy Father     Social History Social History   Tobacco Use   Smoking status: Never   Smokeless tobacco: Never  Substance Use Topics   Alcohol use: No   Drug use: No   Allergies Ativan [lorazepam]  Review of Systems Review of Systems  Constitutional:  Negative for chills and fever.  Respiratory:  Negative for chest tightness and shortness of breath.   Cardiovascular:  Negative for chest pain and palpitations.  Gastrointestinal:  Negative for abdominal pain, nausea and vomiting.  Neurological:  Positive for tremors.  Psychiatric/Behavioral:  The patient is nervous/anxious.   All other systems reviewed and are negative.   Physical Exam Vital Signs  I have reviewed the triage vital signs BP (!) 109/95   Pulse 88   Temp 98.3 F (36.8 C) (Oral)   Resp  17   LMP 08/31/2022   SpO2 100%  Physical Exam Vitals and nursing note reviewed.  Constitutional:      General: She is not in acute distress.    Appearance: Normal appearance.  HENT:     Head: Normocephalic and atraumatic.     Right Ear: External ear normal.     Left Ear: External ear normal.     Nose: Nose normal.     Mouth/Throat:     Mouth: Mucous membranes are moist.  Eyes:     General: No scleral icterus.       Right eye: No discharge.        Left eye: No discharge.  Cardiovascular:     Rate and Rhythm: Regular rhythm. Tachycardia present.     Pulses: Normal pulses.     Heart sounds: Normal heart sounds.  Pulmonary:     Effort: Pulmonary effort is normal. No respiratory distress.     Breath sounds: Normal breath sounds. No stridor.  Abdominal:     General: Abdomen is flat. There is no distension.     Palpations: Abdomen is soft.     Tenderness: There is no abdominal tenderness.  Musculoskeletal:     Cervical back: No rigidity.     Right lower leg: No edema.     Left lower leg: No edema.  Skin:    General: Skin is warm and dry.     Capillary Refill: Capillary refill takes less than 2 seconds.  Neurological:     Mental Status: She is alert.     Motor: Tremor present.  Psychiatric:        Mood and Affect: Mood normal.        Behavior:  Behavior normal. Behavior is cooperative.     ED Results and Treatments Labs (all labs ordered are listed, but only abnormal results are displayed) Labs Reviewed - No data to display                                                                                                                        Radiology No results found.  Pertinent labs & imaging results that were available during my care of the patient were reviewed by me and considered in my medical decision making (see MDM for details).  Medications Ordered in ED Medications - No data to display                                                                                                                                    Procedures Procedures  (including critical care time)  Medical Decision Making / ED Course    Medical Decision Making:    Elizabeth Hines is a 37 y.o. female with history above here secondary to complaint of anxiety. The complaint involves an extensive differential diagnosis and also carries with it a high risk of complications and morbidity.  Serious etiology was considered. Ddx includes but is not limited to: Psychiatric disturbance, metabolic derangement, intoxication, psychosis, etc.  Complete initial physical exam performed, notably the patient  was patient reports feeling very anxious, no chest pain or palpitations.  Heart rate mildly elevated.    Reviewed and confirmed nursing documentation for past medical history, family history, social history.  Vital signs reviewed.    Clinical Course as of 09/30/22 0220  Fri Sep 29, 2022  2358 Pt reports ongoing improvement to her symptoms  [SG]    Clinical Course User Index [SG] Sloan Leiter, DO     Patient here with anxiety, she has history of anxiety.  I offered multiple medications for anxiolysis to which she reports she is unsure if that is something that she would like to try.  Reports that trying a medication ofttimes makes her more nervous and anxious.  She wants to be observed for period time to monitor for continued improvement of her symptoms.  Given drink and snack.  She reports that she has intermittent tremors or tics.  These are oftentimes exacerbated by her panic attacks, increased anxiety.   She is feeling better on recheck.  Tolerating p.o. without difficulty.  No SI or HI.  She  does not want to take any benzos but is willing to try Atarax at home.  Will send prescription to pharmacy.  Advised to follow-up with her counselor/therapist  Advised to follow-up with [facility] if needed    The patient improved significantly and was discharged in stable condition. Detailed  discussions were had with the patient regarding current findings, and need for close f/u with PCP or on call doctor. The patient has been instructed to return immediately if the symptoms worsen in any way for re-evaluation. Patient verbalized understanding and is in agreement with current care plan. All questions answered prior to discharge.                Additional history obtained: -Additional history obtained from na -External records from outside source obtained and reviewed including: Chart review including previous notes, labs, imaging, consultation notes including  Primary care documentation, home medications   Lab Tests: na  EKG   EKG Interpretation Date/Time:    Ventricular Rate:    PR Interval:    QRS Duration:    QT Interval:    QTC Calculation:   R Axis:      Text Interpretation:           Imaging Studies ordered: na   Medicines ordered and prescription drug management: Meds ordered this encounter  Medications   hydrOXYzine (ATARAX) 25 MG tablet    Sig: Take 1 tablet (25 mg total) by mouth at bedtime as needed for anxiety.    Dispense:  10 tablet    Refill:  0    -I have reviewed the patients home medicines and have made adjustments as needed   Consultations Obtained: a   Cardiac Monitoring: Continuous pulse oximetry interpreted by myself, 99% on RA.   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   Per Colpo               11/06/07 - CIN I              2010 - Negative              11/03/2010 - Negative for Lesions                PAP Hx:                08/15/07 - ASC-US + High Risk HPV              09/06/2010 - LSIL              11/03/2010 - ASC-US + High Risk HPV              11/07/2011 - NORMAL - REPEAT IN 1 year              12/02/12 - Absent Tran Zone - REPEAT indicated              12/30/2012   Palpitations 12/19/2011   Paresthesias of bilateral upper limbs, worse on right 04/09/2012   Trichomonas    10/14/07; 11/25/07; 06/01/08   Vaginal discharge 10/31/2010      Dispostion: Disposition decision including need  for hospitalization was considered, and patient discharged from emergency department.    Final Clinical Impression(s) / ED Diagnoses Final diagnoses:  Anxiety        Sloan Leiter, DO 09/30/22 0220

## 2022-09-29 NOTE — ED Triage Notes (Signed)
Pt brought by EMS for SOB x1 hr from home. VSS. Hx of anxiety and anxiety attacks. Pt reports feels similar to anxiety attacks in past.

## 2022-09-29 NOTE — ED Triage Notes (Addendum)
Pt reports that she cannot catch her breath. Started hour ago. EMS came out and she initially refused transport due to being able to calm down. Took buspar around 8pm- px by EDP from previous visit for panic attack. Called EMS second time bc panic returned and transported. Pt presenting with tics and denies drug or ETOH use. Hx of panic attacks. Trying to focus on breathing.

## 2022-09-30 ENCOUNTER — Ambulatory Visit (HOSPITAL_COMMUNITY)
Admission: EM | Admit: 2022-09-30 | Discharge: 2022-09-30 | Disposition: A | Discharge status: Home / Self Care | Payer: No Payment, Other | Attending: Family | Admitting: Family

## 2022-09-30 DIAGNOSIS — F411 Generalized anxiety disorder: Secondary | ICD-10-CM | POA: Insufficient documentation

## 2022-09-30 MED ORDER — HYDROXYZINE HCL 25 MG PO TABS: 25.0000 mg | ORAL_TABLET | Freq: Every evening | ORAL | 0 refills | Status: DC | PRN

## 2022-09-30 MED ORDER — HYDROXYZINE HCL 25 MG PO TABS
25.0000 mg | ORAL_TABLET | Freq: Once | ORAL | Status: AC
  Administered 2022-09-30: 25 mg via ORAL
  Filled 2022-09-30: qty 1

## 2022-09-30 NOTE — Discharge Instructions (Addendum)
It was a pleasure caring for you today in the emergency department.  Please follow-up with your counselor at your next scheduled appointment  Please return to the emergency department for any worsening or worrisome symptoms.

## 2022-09-30 NOTE — Progress Notes (Signed)
   09/30/22 1755  [facility] Triage Screening (Walk-ins at [facility] only)  How Did You Hear About [place]? Legal System  What Is the Reason for Your Visit/Call Today? Pt presents to [facility] accompanied by [employer] due to having worsening panic attacks that occur frequently every month. Pt reports that her heart was racing, she couldn't catch her breath and could not calm down. Pt reports having intrusive thoughts that she is having a hard time controling her breath. Pt reports she is always anxious and jumpy, feeling like she can't sit down. Pt reports this has been going on since she was a teen. Pt reports she is taking Buspar to help with anxiety. Pt denies SI, HI, AVH. Pt denies alcohol and drug use.  How Long Has This Been Causing You Problems? <Week  Have You Recently Had Any Thoughts About Hurting Yourself? No  Are You Planning to Commit Suicide/Harm Yourself At This time? No  Have you Recently Had Thoughts About Hurting Someone [name]? No  Are You Planning To Harm Someone At This Time? No  Are you currently experiencing any auditory, visual or other hallucinations? No  Have You Used Any Alcohol or Drugs in the Past 24 Hours? No  Do you have any current medical co-morbidities that require immediate attention? No  What Do You Feel Would Help You the Most Today? Medication(s)  If access to [facility] was not available, would you have sought care in the Emergency Department? No  Determination of Need Routine (7 days)  Options For Referral Outpatient Therapy

## 2022-09-30 NOTE — ED Provider Notes (Addendum)
Behavioral Health Urgent Care Medical Screening Exam  Patient Name: Elizabeth Hines MRN: [id number] Date of Evaluation: 09/30/22 Chief Complaint:  "Increased anxiety" Diagnosis:  Final diagnoses:  Generalized anxiety disorder    History of Present illness: Elizabeth Hines is a 37 y.o. female.  [facility] accompanied by [facility].  She reports worsening anxiety over the past few weeks.  States she was recently seen and evaluated at the local emergency department where she was discharged with Atarax.  She denies that she has taken medications as indicated states " I am afraid to take medications."  Reports she has been prescribed BuSpar however states that this medication is not alleviating her symptoms.  Education provided for efficacy with BuSpar.   She denied previous inpatient admissions.  Denied history of self injures.  She denies suicidal or homicidal ideations.  Denies auditory visual hallucinations.  She does report that she is currently followed by therapy services.  Patient denied any recent triggers or stressors.  States she resides alone.  She denied illicit drug use or substance abuse history.  Plan: Patient was ordered 25 mg hydroxyzine prior to discharge.  Patient was encouraged to pick up current prescription for hydroxyzine 25 mg to 50 mg daily.  Discussed following up with walk-in services at [facility].  Patient was receptive to plan.  Support, encouragement  and reassurance was provided.   Elizabeth Hines is sitting in no acute distress.  She is alert/oriented x 4; calm/cooperative; and mood congruent with affect.  She is speaking in a clear tone at moderate volume, and normal pace; with good eye contact.  Her thought process is coherent and relevant; There is no indication that she is currently responding to internal/external stimuli or experiencing delusional thought content; and she has denied  suicidal/self-harm/homicidal ideation, psychosis, and paranoia.   Patient has remained calm throughout assessment and has answered questions appropriately.    At this time Elizabeth Hines is educated and verbalizes understanding of mental health resources and other crisis services in the community.She is instructed to call 911 and present to the nearest emergency room should she experience any suicidal/homicidal ideation, auditory/visual/hallucinations, or detrimental worsening of her mental health condition.  She was a also advised by Clinical research associate that she could call the toll-free phone on insurance card to assist with identifying in network counselors and agencies or number on back of Medicaid card to speak with care coordinator.    Flowsheet Row ED from 09/30/2022 in [facility] ED from 09/29/2022 in [facility] Emergency Department at [facility] from 09/22/2022 in [facility] Emergency Department at [facility]  C-SSRS RISK CATEGORY No Risk No Risk No Risk       Psychiatric Specialty Exam  Presentation  General Appearance:Appropriate for Environment  Eye Contact:Good  Speech:Clear and Coherent  Speech Volume:Normal  Handedness:Right   Mood and Affect  Mood: Anxious  Affect: Congruent   Thought Process  Thought Processes: Coherent  Descriptions of Associations:Intact  Orientation:Full (Time, Place and Person)  Thought Content:Logical  Diagnosis of Schizophrenia or Schizoaffective disorder in past: No   Hallucinations:None  Ideas of Reference:None  Suicidal Thoughts:No  Homicidal Thoughts:No   Sensorium  Memory: Immediate Good; Recent Good; Remote Good  Judgment: Good  Insight: Good   Executive Functions  Concentration: Good  Attention Span: Fair  Recall: Good  Fund of Knowledge: Good  Language: Good   Psychomotor Activity  Psychomotor Activity: Normal  Assets  Assets: Desire for  Improvement; Social Support   Sleep  Sleep: Good  Number of hours: No data recorded  Physical Exam: Physical Exam Vitals and nursing note reviewed.  Constitutional:      Appearance: Normal appearance.  Cardiovascular:     Rate and Rhythm: Normal rate and regular rhythm.  Neurological:     Mental Status: She is alert and oriented to person, place, and time.  Psychiatric:        Mood and Affect: Mood normal.        Behavior: Behavior normal.    Review of Systems  Psychiatric/Behavioral:  Negative for depression and hallucinations. The patient is nervous/anxious.   All other systems reviewed and are negative.  Blood pressure 93/79, pulse 89, temperature 98.6 F (37 C), temperature source Oral, resp. rate 19, last menstrual period 08/31/2022, SpO2 100%. There is no height or weight on file to calculate BMI.  Musculoskeletal: Strength & Muscle Tone: within normal limits Gait & Station: normal Patient leans: N/A   [facility] MSE Discharge Disposition for Follow up and Recommendations: Based on my evaluation the patient does not appear to have an emergency medical condition and can be discharged with resources and follow up care in outpatient services for Medication Management, Partial Hospitalization Program, and Individual Therapy   Oneta Rack, NP 09/30/2022, 6:51 PM

## 2022-09-30 NOTE — Discharge Instructions (Signed)

## 2022-10-06 ENCOUNTER — Ambulatory Visit (INDEPENDENT_AMBULATORY_CARE_PROVIDER_SITE_OTHER): Payer: Self-pay | Admitting: Family Medicine

## 2022-10-06 ENCOUNTER — Encounter: Payer: Self-pay | Admitting: Family Medicine

## 2022-10-06 VITALS — BP 91/78 | HR 79 | Ht 70.0 in | Wt 186.8 lb

## 2022-10-06 DIAGNOSIS — F411 Generalized anxiety disorder: Secondary | ICD-10-CM

## 2022-10-06 DIAGNOSIS — F331 Major depressive disorder, recurrent, moderate: Secondary | ICD-10-CM

## 2022-10-06 MED ORDER — SERTRALINE HCL 50 MG PO TABS
ORAL_TABLET | ORAL | 0 refills | Status: AC
Start: 2022-10-06 — End: 2022-11-05

## 2022-10-06 MED ORDER — BUSPIRONE HCL 5 MG PO TABS: 10.0000 mg | ORAL_TABLET | Freq: Two times a day (BID) | ORAL | Status: DC

## 2022-10-06 NOTE — Patient Instructions (Addendum)
Thank you for coming in today!  Things we discussed today: Please start taking Zoloft daily as prescribed. This medication is part of a family of medications called SSRI and can take about 4-6 weeks to reach its maximal effect so we will watch to make sure you tolerate it well and increase the dose as needed. 2.  Please continue taking Buspar as prescribed.  Please make an appointment in 1-2 weeks to see any provider here at [facility].  Have a great day!

## 2022-10-06 NOTE — Progress Notes (Signed)
SUBJECTIVE:   CHIEF COMPLAINT / HPI:   Anxiety -recent [facility] ED visit, no SI/HI, discharged with 25mg  hydroxyzine -PHQ-9 score 21, endorses several days on question 9 - endorses intrusive thought of stabbing herself from time to time but denies any plan or intention to harm herself -GAD 7 score 21 -of note, has tic disorder at baseline, this causes frequent fidgeting and appearance of restlessness -difficulty sleeping, staying calm in public -has lost about 14 pounds since last visit, endorses decreased appetite, no vomiting, some diarrhea -had 2 panic attacks recently - heart pounding, feeling of doom/something bad is going to happen, hard to catch her breath, lots of fear around not being able to catch her breath -has been out of work for 2 weeks to try to minimize symptoms (works at [employer]) -was prescribed Buspar 5mg  BID at appointment in August, has been taking Buspar 10mg  BID due to not feeling any effect from the lower dose -open to trying another medication, has many questions/concerns about SSRIs -sees a therapist regularly, feels she has good rapport and it is going well -endorses depressive symptoms over the past 2 weeks -endorses some intrusive thoughts - "what if I have heart problems because I can't breathe right" "what if I get injured" -has had bad anxiety since 2016 -no hallucinations, has heard voices in the past (about 8 years ago) -no manic symptoms -no self harm in the past -no past hospitalizations  PERTINENT  PMH / PSH: tic disorder, MDD, GAD  OBJECTIVE:   BP 91/78   Pulse 79   Ht 5\' 10"  (1.778 m)   Wt 186 lb 12.8 oz (84.7 kg)   LMP 10/01/2022   SpO2 100%   BMI 26.80 kg/m     Physical Exam Gen: well dressed and well groomed young woman, fidgety, sitting in exam room in NAD CV: RRR, normal S1/S2, no murmurs, rubs, or gallops Pulm: CTAB, normal WOB, no wheezes, rales, or rhonchi Psych: anxious mood, restless, good eye contact, not responding to  internal stimuli, cooperative with exam, organized thought process, no HI/SI, good insight and judgement        ASSESSMENT/PLAN:   Generalized anxiety disorder GAD7 score 21, no recent stressors but has had 2 panic attacks recently. Symptoms are interfering with daily activities, including keeping patient out of work for the past two weeks. Having anxious thoughts, especially around her breathing. Endorses some occasional intrusive thoughts that include thoughts of self harm, denies active plan or intention to harm self at this time. Denies hallucinations. No mania or history of manic episodes that would preclude use of SSRI. Has been taking BuSpar 10mg  BID for the past month.   --Continue BuSpar 10mg  BID  --Add Zoloft 25mg  daily today, increase to 50mg  after 4 days if tolerating well --Answered questions about SSRI side effects, reviewed that it can take 4-6 weeks to reach maximal efficacy --Return to clinic in 1-2 weeks to uptitrate SSRI  Major depressive disorder, recurrent episode, moderate (HCC) PHQ 9 score 21 with no active SI/HI. Endorses severe symptoms for the past 2 weeks including difficulty falling asleep, unintended 14 pound weight loss, and constant anxious thoughts. Denies GI symptoms, suspect weight loss is secondary to anxiety/depression limiting appetite. Has not taken SSRI before, was prescribed Prozac in 2014 but never started this. Agreeable to adding SSRI now, will start with low dose Zoloft and uptitrate as able. --medication changes as above    Patient Instructions  Thank you for coming in today!  Things we discussed today: Please start taking Zoloft daily as prescribed. This medication is part of a family of medications called SSRI and can take about 4-6 weeks to reach its maximal effect so we will watch to make sure you tolerate it well and increase the dose as needed. 2.  Please continue taking Buspar as prescribed.  Please make an appointment in 1-2 weeks to  see any provider here at [facility].  Have a great day!  Lorayne Bender, MD [facility]

## 2022-10-06 NOTE — Assessment & Plan Note (Addendum)
PHQ 9 score 21 with no active SI/HI. Endorses severe symptoms for the past 2 weeks including difficulty falling asleep, unintended 14 pound weight loss, and constant anxious thoughts. Denies GI symptoms, suspect weight loss is secondary to anxiety/depression limiting appetite. Has not taken SSRI before, was prescribed Prozac in 2014 but never started this. Agreeable to adding SSRI now, will start with low dose Zoloft and uptitrate as able. --medication changes as above

## 2022-10-06 NOTE — Assessment & Plan Note (Addendum)
GAD7 score 21, no recent stressors but has had 2 panic attacks recently. Symptoms are interfering with daily activities, including keeping patient out of work for the past two weeks. Having anxious thoughts, especially around her breathing. Endorses some occasional intrusive thoughts that include thoughts of self harm, denies active plan or intention to harm self at this time. Denies hallucinations. No mania or history of manic episodes that would preclude use of SSRI. Has been taking BuSpar 10mg  BID for the past month.   --Continue BuSpar 10mg  BID  --Add Zoloft 25mg  daily today, increase to 50mg  after 4 days if tolerating well --Answered questions about SSRI side effects, reviewed that it can take 4-6 weeks to reach maximal efficacy --Return to clinic in 1-2 weeks to uptitrate SSRI

## 2022-10-18 ENCOUNTER — Other Ambulatory Visit: Payer: Self-pay

## 2022-10-18 DIAGNOSIS — F411 Generalized anxiety disorder: Secondary | ICD-10-CM

## 2022-10-18 MED ORDER — BUSPIRONE HCL 5 MG PO TABS: 10.0000 mg | ORAL_TABLET | Freq: Two times a day (BID) | ORAL | 2 refills | Status: DC

## 2022-10-18 NOTE — Telephone Encounter (Signed)
Patient calls nurse line requesting prescription refill on Buspirone 5 mg. This was ordered by Dr. Dolan Amen on 10/06/22, however, was ordered as "no print."   Patient is requesting that this be sent to [facility] on [address].   Prescription needs updated dispense quantity and number of refills.   Veronda Prude, RN

## 2022-10-23 ENCOUNTER — Ambulatory Visit (INDEPENDENT_AMBULATORY_CARE_PROVIDER_SITE_OTHER): Payer: Self-pay | Admitting: Family Medicine

## 2022-10-23 ENCOUNTER — Encounter: Payer: Self-pay | Admitting: Family Medicine

## 2022-10-23 VITALS — BP 90/50 | HR 80 | Ht 70.0 in | Wt 182.0 lb

## 2022-10-23 DIAGNOSIS — M67912 Unspecified disorder of synovium and tendon, left shoulder: Secondary | ICD-10-CM

## 2022-10-23 DIAGNOSIS — D509 Iron deficiency anemia, unspecified: Secondary | ICD-10-CM

## 2022-10-23 DIAGNOSIS — F411 Generalized anxiety disorder: Secondary | ICD-10-CM

## 2022-10-23 DIAGNOSIS — F959 Tic disorder, unspecified: Secondary | ICD-10-CM

## 2022-10-23 DIAGNOSIS — I498 Other specified cardiac arrhythmias: Secondary | ICD-10-CM

## 2022-10-23 MED ORDER — SERTRALINE HCL 50 MG PO TABS
50.0000 mg | ORAL_TABLET | Freq: Every day | ORAL | 0 refills | Status: DC
Start: 2022-10-23 — End: 2022-12-01

## 2022-10-23 NOTE — Assessment & Plan Note (Signed)
Appears to be a chronic issue for the patient and is contributing to overall feeling of restlessness that has been exacerbated by anxiety. Consider referral to neurology in the future once anxiety is better controlled.

## 2022-10-23 NOTE — Assessment & Plan Note (Addendum)
Subjective improvement in anxiety and objective improvement on PHQ and GAD assessments after 2 weeks of Zoloft. Pt still struggling to complete a full day of work due to symptoms. No SI/HI --Zoloft 50 mg daily for another 5 weeks to reach full efficacy --Continue BuSpar 10 mg twice daily --work letter restricting to 7 hours/day and breaks throughout the day --follow up in 4-6 weeks

## 2022-10-23 NOTE — Assessment & Plan Note (Signed)
Endorsing chest fluttering over the past few weeks. Patient's other symptoms include poor appetite and sleep as well as significant weight loss over the past month. Differential includes ongoing manifestations of anxiety vs hyperthyroidism vs angina vs anemia. The chest pain symptoms do not appear consistent with angina due to the short duration, quality of pain (sharp/poking vs chest pressure), and lack of other risk factors for CAD. Last TSH normal in 2016.  Patient also has a history of iron deficiency anemia and had a very low ferritin in March, this is likely contributing to her fatigue and could be causing palpitations and chest fluttering. -CBC and ferritin today -TSH today

## 2022-10-23 NOTE — Assessment & Plan Note (Addendum)
Patient with history of iron deficiency anemia and very low ferritin.  Has been taking daily oral iron supplements.  Continues to endorse difficulty sleeping and generalized fatigue.   --CBC and ferritin today --continue daily Fe supplement --can consider iron infusion in the future.

## 2022-10-23 NOTE — Patient Instructions (Signed)
Thank you for coming in today!  Things we discussed today: Please continue taking BuSpar and Zoloft as prescribed. I will see you in about 5 weeks and we can discuss changing these medications if needed. 2. I am checking a few labs today. I will send you a MyChart message or call with the results in the next few days.  Have a great day!

## 2022-10-23 NOTE — Progress Notes (Signed)
    SUBJECTIVE:   CHIEF COMPLAINT / HPI:   F/u anxiety -started zoloft 25mg  --> 50mg  daily -also taking BuSpar 10mg  BID -GAD-7 score 14 (was 21 at last appt) -PHQ-9 score 17 (was 21 at last appt) -notices more anxiety early in the morning, this improves throughout the day -still having difficulty sleeping, jittery, shaky -has a few seconds of sharp/crampy chest pain/fluttering very occasionally, some associated pain along L shoulder and neck -feels that her overall anxiety has improved but is still present -does not feel she is depressed -no panic attacks -requesting work accommodations as symptoms are causing her to leave work early most days -no SI/HI -no hallucinations  Tic disorder -very fidgety, this makes her feel overly tired throughout the day -has not been seen by a specialist for this  PERTINENT  PMH / PSH: IDA, tic disorder  OBJECTIVE:   BP (!) 90/50   Pulse 80   Ht 5\' 10"  (1.778 m)   Wt 182 lb (82.6 kg)   LMP 10/01/2022   SpO2 99%   BMI 26.11 kg/m   Initial BP 100/50  Physical Exam Gen: Young woman sitting in exam room, fidgety and in no acute distress CV: Regular rate and rhythm, normal S1/S2, no murmurs, rubs, gallops Pulm: Clear to auscultation bilaterally, normal work of breathing, no wheezes, rales, rhonchi Psych: Anxious affect, appropriate mood, normal insight and judgment  ASSESSMENT/PLAN:   Generalized anxiety disorder Subjective improvement in anxiety and objective improvement on PHQ and GAD assessments after 2 weeks of Zoloft. Pt still struggling to complete a full day of work due to symptoms. No SI/HI --Zoloft 50 mg daily for another 5 weeks to reach full efficacy --Continue BuSpar 10 mg twice daily --work letter restricting to 7 hours/day and breaks throughout the day --follow up in 4-6 weeks  Iron deficiency anemia Patient with history of iron deficiency anemia and very low ferritin.  Has been taking daily oral iron supplements.   Continues to endorse difficulty sleeping and generalized fatigue.   --CBC and ferritin today --continue daily Fe supplement --can consider iron infusion in the future.  Fluttering heart Endorsing chest fluttering over the past few weeks. Patient's other symptoms include poor appetite and sleep as well as significant weight loss over the past month. Differential includes ongoing manifestations of anxiety vs hyperthyroidism vs angina vs anemia. The chest pain symptoms do not appear consistent with angina due to the short duration, quality of pain (sharp/poking vs chest pressure), and lack of other risk factors for CAD. Last TSH normal in 2016.  Patient also has a history of iron deficiency anemia and had a very low ferritin in March, this is likely contributing to her fatigue and could be causing palpitations and chest fluttering. -CBC and ferritin today -TSH today  Tic disorder Appears to be a chronic issue for the patient and is contributing to overall feeling of restlessness that has been exacerbated by anxiety. Consider referral to neurology in the future once anxiety is better controlled.    Patient Instructions  Thank you for coming in today!  Things we discussed today: Please continue taking BuSpar and Zoloft as prescribed. I will see you in about 5 weeks and we can discuss changing these medications if needed. 2. I am checking a few labs today. I will send you a MyChart message or call with the results in the next few days.  Have a great day!  Lorayne Bender, MD [facility]

## 2022-10-24 LAB — CBC
Hematocrit: 33.4 % — ABNORMAL LOW (ref 34.0–46.6)
Hemoglobin: 9.6 g/dL — ABNORMAL LOW (ref 11.1–15.9)
MCH: 20.9 pg — ABNORMAL LOW (ref 26.6–33.0)
MCHC: 28.7 g/dL — ABNORMAL LOW (ref 31.5–35.7)
MCV: 73 fL — ABNORMAL LOW (ref 79–97)
Platelets: 303 10*3/uL (ref 150–450)
RBC: 4.59 x10E6/uL (ref 3.77–5.28)
RDW: 18.4 % — ABNORMAL HIGH (ref 11.7–15.4)
WBC: 5.7 10*3/uL (ref 3.4–10.8)

## 2022-10-24 LAB — FERRITIN: Ferritin: 4 ng/mL — ABNORMAL LOW (ref 15–150)

## 2022-10-24 LAB — TSH RFX ON ABNORMAL TO FREE T4: TSH: 1.45 u[IU]/mL (ref 0.450–4.500)

## 2022-10-25 ENCOUNTER — Ambulatory Visit (HOSPITAL_COMMUNITY): Payer: No Payment, Other | Admitting: Licensed Clinical Social Worker

## 2022-11-15 ENCOUNTER — Ambulatory Visit (HOSPITAL_COMMUNITY): Payer: No Payment, Other | Admitting: Licensed Clinical Social Worker

## 2022-12-01 ENCOUNTER — Ambulatory Visit (INDEPENDENT_AMBULATORY_CARE_PROVIDER_SITE_OTHER): Payer: Self-pay | Admitting: Family Medicine

## 2022-12-01 ENCOUNTER — Encounter: Payer: Self-pay | Admitting: Family Medicine

## 2022-12-01 VITALS — BP 127/79 | HR 64 | Ht 70.0 in | Wt 183.8 lb

## 2022-12-01 DIAGNOSIS — D509 Iron deficiency anemia, unspecified: Secondary | ICD-10-CM

## 2022-12-01 DIAGNOSIS — F411 Generalized anxiety disorder: Secondary | ICD-10-CM

## 2022-12-01 MED ORDER — SERTRALINE HCL 100 MG PO TABS
100.0000 mg | ORAL_TABLET | Freq: Every day | ORAL | 1 refills | Status: DC
Start: 2022-12-01 — End: 2023-01-30

## 2022-12-01 NOTE — Progress Notes (Cosign Needed)
    SUBJECTIVE:   CHIEF COMPLAINT / HPI:   Anxiety follow up -on zoloft 50mg  daily for 6 weeks and BuSpar 10mg  BID -feels much better overall -was able to work a full week this week without taking extra breaks -still feels a bit jittery but this is improved -sleep is okay, still wakes up in middle of the night sometimes and feels restless sometimes -appetite feels normal now -GAD 7 score 6 (from 14)   PERTINENT  PMH / PSH: IDA, tic disorder  OBJECTIVE:   BP 127/79   Pulse 64   Ht 5\' 10"  (1.778 m)   Wt 183 lb 12.8 oz (83.4 kg)   SpO2 100%   BMI 26.37 kg/m    Physical exam - General: No acute distress. Awake and conversant. - ENT: Hearing grossly intact. No nasal discharge. - Neck: Neck is supple. No masses or thyromegaly. - Respiratory: Respirations are non-labored. - Skin: No visible rashes or ulcers. - Psych: Alert and oriented. Calm, appropriate mood and affect, Normal judgment. - MSK: Normal ambulation. - Neuro: Sensation and CN II-XII grossly normal.   ASSESSMENT/PLAN:   Generalized anxiety disorder Symptoms much improved overall with GAD score 6 from 14 at last visit. Continues to have some feelings of restlessness/jittery. Increase Zoloft to 100mg  daily and continue Buspar 10mg  BID. Follow up in 1 month.  Iron deficiency anemia Taking iron supplement every other day. Fatigue has improved. Will continue to monitor.     Lorayne Bender, MD [facility]

## 2022-12-01 NOTE — Assessment & Plan Note (Addendum)
Symptoms much improved overall with GAD score 6 from 14 at last visit. Continues to have some feelings of restlessness/jittery. Increase Zoloft to 100mg  daily and continue Buspar 10mg  BID. Follow up in 1 month.

## 2022-12-01 NOTE — Patient Instructions (Addendum)
Thank you for coming in today!  Things we discussed today:  We are increasing your Zoloft to 100mg  daily. You can take 2 of the 50mg  tablets you have and then start taking 1 of the new prescription daily. Please continue taking the BuSpar 10mg  twice daily.  Have a great day!

## 2022-12-01 NOTE — Assessment & Plan Note (Signed)
Taking iron supplement every other day. Fatigue has improved. Will continue to monitor.

## 2022-12-02 ENCOUNTER — Other Ambulatory Visit: Payer: Self-pay | Admitting: Family Medicine

## 2022-12-02 DIAGNOSIS — F411 Generalized anxiety disorder: Secondary | ICD-10-CM

## 2022-12-22 ENCOUNTER — Other Ambulatory Visit: Payer: Self-pay | Admitting: Family Medicine

## 2022-12-22 DIAGNOSIS — F411 Generalized anxiety disorder: Secondary | ICD-10-CM

## 2022-12-25 ENCOUNTER — Other Ambulatory Visit: Payer: Self-pay | Admitting: Family Medicine

## 2022-12-25 DIAGNOSIS — F411 Generalized anxiety disorder: Secondary | ICD-10-CM

## 2022-12-25 MED ORDER — BUSPIRONE HCL 5 MG PO TABS: 10.0000 mg | ORAL_TABLET | Freq: Two times a day (BID) | ORAL | 3 refills | Status: DC

## 2022-12-29 ENCOUNTER — Other Ambulatory Visit (HOSPITAL_COMMUNITY)
Admission: RE | Admit: 2022-12-29 | Discharge: 2022-12-29 | Disposition: A | Discharge status: Home / Self Care | Payer: Self-pay | Source: Ambulatory Visit | Attending: Family Medicine | Admitting: Family Medicine

## 2022-12-29 ENCOUNTER — Ambulatory Visit (INDEPENDENT_AMBULATORY_CARE_PROVIDER_SITE_OTHER): Payer: Self-pay | Admitting: Family Medicine

## 2022-12-29 ENCOUNTER — Encounter: Payer: Self-pay | Admitting: Family Medicine

## 2022-12-29 VITALS — BP 104/69 | HR 68 | Ht 70.0 in | Wt 183.2 lb

## 2022-12-29 DIAGNOSIS — Z113 Encounter for screening for infections with a predominantly sexual mode of transmission: Secondary | ICD-10-CM

## 2022-12-29 DIAGNOSIS — R300 Dysuria: Secondary | ICD-10-CM

## 2022-12-29 DIAGNOSIS — F411 Generalized anxiety disorder: Secondary | ICD-10-CM

## 2022-12-29 DIAGNOSIS — D509 Iron deficiency anemia, unspecified: Secondary | ICD-10-CM

## 2022-12-29 DIAGNOSIS — K5903 Drug induced constipation: Secondary | ICD-10-CM

## 2022-12-29 MED ORDER — POLYETHYLENE GLYCOL 3350 17 GM/SCOOP PO POWD
17.0000 g | Freq: Two times a day (BID) | ORAL | 3 refills | Status: DC | PRN
Start: 2022-12-29 — End: 2023-05-10

## 2022-12-29 NOTE — Progress Notes (Cosign Needed Addendum)
    SUBJECTIVE:   CHIEF COMPLAINT / HPI:   Anxiety follow up -still feels jumpy from time to time -able to get through the workday, still would like to take breaks due to fatigue -sleep has been okay, getting 6-7 hours of sleep per night -does not notice a big difference from the increased zoloft, maybe a bit of drowsiness -trying to get back on a regular schedule with counseling  STI check -new sexual partner -some vaginal irritation with wiping -some burning with urination -no abnormal vaginal discharge -menstruating currently -uses condoms sometimes  Iron deficiency anemia -menses last 5-7 days with 2 days of light spotting at the end -menstruates every 30 days -has heavy bleeding on the second day of menses, soaks through tampon and pad -typically 4-5 tampons per day -has an anal fissure that bleeds occasionally, has been present for about 1 month -has been more constipated since starting iron supplement, has noticed blood on the toilet paper and a few drops in the toilet after having a hard stool -Sitz baths help -not interested in contraception at this time  PERTINENT  PMH / PSH: tic disorder, anxiety, IDA  OBJECTIVE:   BP 104/69   Pulse 68   Ht 5\' 10"  (1.778 m)   Wt 183 lb 4 oz (83.1 kg)   LMP 11/30/2022   SpO2 100%   BMI 26.29 kg/m    - General: No acute distress. Awake and conversant. - Eyes: Normal conjunctiva, anicteric. Round symmetric pupils. - Respiratory: Respirations are non-labored. - Psych: Alert and oriented. Cooperative, Appropriate mood and affect, Normal judgment. - MSK: Normal ambulation. - Neuro: Sensation and CN II-XII grossly normal.   ASSESSMENT/PLAN:   Iron deficiency anemia Appears that patient has moderately heavy menses that could explain severe IDA. Has an anal fissure that has been lightly bleeding since starting oral Fe supplement and having more constipation, but no reports of melena or hematochezia to raise concern for GI  bleed.  --recheck CBC and ferritin today --continue oral Fe supplement every other day, adding Miralax to help with constipation from this --if minimally improved since starting oral Fe supplement, can investigate for possible malabsorptive disorder --can consider FOBT or colonoscopy in the future if anemia persists --discussed starting contraception to help decrease menstrual bleeding, pt declined. Will continue to offer this  Generalized anxiety disorder Symptoms are stable since last visit with GAD7 score 8 and PHQ9 6. No SI.  --continue Buspar 10mg  BID --continue Zoloft 100mg  daily --encouraged pt to resume regular therapy sessions --scheduled follow up in 1 month   STI check -HIV, RPR, GC/chlamydia, trich, BV, candida, and UA -advised regular condom use  Lorayne Bender, MD [facility]

## 2022-12-29 NOTE — Assessment & Plan Note (Signed)
Symptoms are stable since last visit with GAD7 score 8 and PHQ9 6. No SI.  --continue Buspar 10mg  BID --continue Zoloft 100mg  daily --encouraged pt to resume regular therapy sessions --scheduled follow up in 1 month

## 2022-12-29 NOTE — Assessment & Plan Note (Addendum)
Appears that patient has moderately heavy menses that could explain severe IDA. Has an anal fissure that has been lightly bleeding since starting oral Fe supplement and having more constipation, but no reports of melena or hematochezia to raise concern for GI bleed.  --recheck CBC and ferritin today --continue oral Fe supplement every other day, adding Miralax to help with constipation from this --if minimally improved since starting oral Fe supplement, can investigate for possible malabsorptive disorder --can consider FOBT or colonoscopy in the future if anemia persists --discussed starting contraception to help decrease menstrual bleeding, pt declined. Will continue to offer this

## 2022-12-29 NOTE — Patient Instructions (Addendum)
Thank you for coming in today! Here is a summary of what we discussed:  Please continue taking your Zoloft and Buspar as prescribed.  Please start taking miralax daily to help with constipation. If you notice a significant increase in bleeding from your bottom, please contact our office or seek emergency medical care.   Please use condoms to prevent unwanted pregnancy and STIs. We can discuss contraception options moving forward if you are interested in this.  We are checking some labs today. If they are abnormal, I will call you. If they are normal, I will send you a MyChart message (if it is active) or a letter in the mail. If you do not hear about your labs in the next 2 weeks, please call the office.   You should return to our clinic on 1/10 at 2:10pm.  If you haven't already, sign up for My Chart to have easy access to your labs results, and communication with your primary care physician.  I recommend that you always bring your medications to each appointment as this makes it easy to ensure you are on the correct medications and helps [place] not miss refills when you need them.  Please call the clinic at [phone] if your symptoms worsen or you have any concerns.  Best, Dr Dolan Amen

## 2022-12-30 LAB — URINALYSIS
Glucose, UA: NEGATIVE
Ketones, UA: NEGATIVE
Nitrite, UA: POSITIVE — AB
Specific Gravity, UA: >=1.03 — AB (ref 1.005–1.030)
Urobilinogen, Ur: 0.2 mg/dL (ref 0.2–1.0)
pH, UA: 5.5 (ref 5.0–7.5)

## 2022-12-30 LAB — CBC
Hematocrit: 36.7 % (ref 34.0–46.6)
Hemoglobin: 10.8 g/dL — ABNORMAL LOW (ref 11.1–15.9)
MCH: 22.7 pg — ABNORMAL LOW (ref 26.6–33.0)
MCHC: 29.4 g/dL — ABNORMAL LOW (ref 31.5–35.7)
MCV: 77 fL — ABNORMAL LOW (ref 79–97)
Platelets: 256 10*3/uL (ref 150–450)
RBC: 4.76 x10E6/uL (ref 3.77–5.28)
RDW: 18.1 % — ABNORMAL HIGH (ref 11.7–15.4)
WBC: 5 10*3/uL (ref 3.4–10.8)

## 2022-12-30 LAB — HIV ANTIBODY (ROUTINE TESTING W REFLEX): HIV Screen 4th Generation wRfx: NONREACTIVE

## 2022-12-30 LAB — FERRITIN: Ferritin: 10 ng/mL — ABNORMAL LOW (ref 15–150)

## 2022-12-30 LAB — HEPATITIS B SURFACE ANTIGEN: Hepatitis B Surface Ag: NEGATIVE

## 2022-12-30 LAB — RPR: RPR Ser Ql: NONREACTIVE

## 2022-12-31 ENCOUNTER — Other Ambulatory Visit: Payer: Self-pay | Admitting: Family Medicine

## 2022-12-31 ENCOUNTER — Telehealth: Payer: Self-pay | Admitting: Family Medicine

## 2022-12-31 DIAGNOSIS — N3001 Acute cystitis with hematuria: Secondary | ICD-10-CM

## 2022-12-31 MED ORDER — NITROFURANTOIN MONOHYD MACRO 100 MG PO CAPS
100.0000 mg | ORAL_CAPSULE | Freq: Two times a day (BID) | ORAL | 0 refills | Status: AC
Start: 2022-12-31 — End: 2023-01-05

## 2022-12-31 NOTE — Telephone Encounter (Addendum)
Called patient to discuss lab results. Informed patient that she has a UTI and will need to take antibiotics for this. Appears that vaginal swab for GC/chlamydia, trich, BV, and candida collected during visit on Friday was not sent out, informed patient of this and scheduled follow up visit next week to repeat this.  Also reviewed CBC and ferritin, which have improved since pt started taking oral iron supplement. Advised patient to continue iron supplement and use Miralax for constipation as discussed during visit on Friday.

## 2023-01-01 ENCOUNTER — Telehealth: Payer: Self-pay | Admitting: Family Medicine

## 2023-01-01 ENCOUNTER — Other Ambulatory Visit: Payer: Self-pay | Admitting: Family Medicine

## 2023-01-01 DIAGNOSIS — B3731 Acute candidiasis of vulva and vagina: Secondary | ICD-10-CM

## 2023-01-01 LAB — CERVICOVAGINAL ANCILLARY ONLY
Bacterial Vaginitis (gardnerella): NEGATIVE
Candida Glabrata: NEGATIVE
Candida Vaginitis: POSITIVE — AB
Chlamydia: NEGATIVE
Comment: NEGATIVE
Comment: NEGATIVE
Comment: NEGATIVE
Comment: NEGATIVE
Comment: NEGATIVE
Comment: NORMAL
Neisseria Gonorrhea: NEGATIVE
Trichomonas: NEGATIVE

## 2023-01-01 MED ORDER — FLUCONAZOLE 150 MG PO TABS
ORAL_TABLET | ORAL | 0 refills | Status: DC
Start: 2023-01-01 — End: 2023-05-10

## 2023-01-01 NOTE — Telephone Encounter (Signed)
Called patient to discuss lab results and treatment plan for vaginal candidiasis. Left HIPAA compliant VM. Will send MyChart message with this information.

## 2023-01-04 ENCOUNTER — Ambulatory Visit: Payer: Self-pay | Admitting: Student

## 2023-01-29 ENCOUNTER — Other Ambulatory Visit: Payer: Self-pay | Admitting: Family Medicine

## 2023-01-29 DIAGNOSIS — F411 Generalized anxiety disorder: Secondary | ICD-10-CM

## 2023-01-31 ENCOUNTER — Other Ambulatory Visit: Payer: Self-pay | Admitting: Family Medicine

## 2023-01-31 DIAGNOSIS — F411 Generalized anxiety disorder: Secondary | ICD-10-CM

## 2023-02-02 ENCOUNTER — Ambulatory Visit: Payer: Self-pay | Admitting: Family Medicine

## 2023-02-26 ENCOUNTER — Ambulatory Visit (INDEPENDENT_AMBULATORY_CARE_PROVIDER_SITE_OTHER): Payer: Self-pay | Admitting: Family Medicine

## 2023-02-26 ENCOUNTER — Encounter: Payer: Self-pay | Admitting: Family Medicine

## 2023-02-26 VITALS — BP 110/70 | HR 83 | Ht 70.0 in | Wt 185.0 lb

## 2023-02-26 DIAGNOSIS — D509 Iron deficiency anemia, unspecified: Secondary | ICD-10-CM

## 2023-02-26 DIAGNOSIS — R7303 Prediabetes: Secondary | ICD-10-CM

## 2023-02-26 DIAGNOSIS — F411 Generalized anxiety disorder: Secondary | ICD-10-CM

## 2023-02-26 LAB — POCT GLYCOSYLATED HEMOGLOBIN (HGB A1C): HbA1c, POC (prediabetic range): 5.8 % (ref 5.7–6.4)

## 2023-02-26 MED ORDER — BUSPIRONE HCL 15 MG PO TABS: 15.0000 mg | ORAL_TABLET | Freq: Two times a day (BID) | ORAL | 2 refills | Status: DC

## 2023-02-26 MED ORDER — SERTRALINE HCL 100 MG PO TABS
100.0000 mg | ORAL_TABLET | Freq: Every day | ORAL | 0 refills | Status: DC
Start: 2023-02-26 — End: 2023-03-29

## 2023-02-26 NOTE — Patient Instructions (Addendum)
Thank you for coming in today! Here is a summary of what we discussed:  Please start taking 15mg  buspar twice daily. I encourage you to find a therapist who has more availability and start weekly therapy focusing on anxiety.  We are checking some labs today. If they are abnormal, I will call you. If they are normal, I will send you a MyChart message (if it is active) or a letter in the mail. If you do not hear about your labs in the next 2 weeks, please call the office.    If you haven't already, sign up for My Chart to have easy access to your labs results, and communication with your primary care physician.  I recommend that you always bring your medications to each appointment as this makes it easy to ensure you are on the correct medications and helps [place] not miss refills when you need them.  Please call the clinic at [phone] if your symptoms worsen or you have any concerns.  Best, Dr Dolan Amen   Outpatient Mental Health Providers (No Insurance required or Self Pay)  [facility]  [phone] [email or web address] [facility] and [facility]  [address] [place], [place] [phone] Crisis [phone]  [facility]) can see uninsured folks for outpatient therapy [email or web address] [address] [place], [place] [address] [phone]  [facility]    Walk-in Mon-Fri, 8am-3pm [email or web address] [email or web address] [address] [place], [place]  [phone]   [address]   [phone] [facility] for psych med management, there may be a wait- if [facility] is working with clients for OPT, they will coordinate with [facility] for psych  [facility]   Walk-in-Clinic: Monday- Friday 9:00 AM - 4:00 PM [address]   [place], [place] [phone]  [facility] [place] ([employer]) walk in M-F 8am-12pm and   1pm-3pm [place]- [address]     [phone]  [employer] -[address]  Phone: [phone]  [employer] (Mental Health and substance challenges) [address]   [place] [phone]    [email or web address]    [address]  [employer] [facility] [place]  [facility] -[address], [place]     Phone:  [phone] [facility]-  [address]  [phone]    [facility] ( virtual or zoom therapy) [email or web address]  [address] [place]  [phone]    [facility] [phone]  grief counseling, dementia and caregiver support    [facility] Walk-in MWF 12:30 to 3:00     [address] [place] [address]  [phone]  [email or web address] call to schedule an appointment     [employer] on Mental Illness (NAMI) [place]- Wellness classes, Support groups        [address], [place], [place] [address] [phone]   [email or web address]   [facility]  (Psycho-social Rehabilitation clubhouse, Individual and group therapy) [address] [place], [place] [address]   336- [phone]  24- Hour Availability:  [facility] [phone] or [phone] * [facility] [employer] (Domestic Violence, Rape, etc. )[phone] Vesta Mixer [phone] or [phone] * [facility] [phone] only) [phone] (after hours) *[facility] Mobile Crisis Unit [phone] *[place] National Suicide Hotline [phone] Len Childs)

## 2023-02-26 NOTE — Progress Notes (Unsigned)
    SUBJECTIVE:   CHIEF COMPLAINT / HPI:   *** Iron deficiency anemia Menses changed at all? No, 1 heavy day per cycle, regular every month GI sx? Blood in stool? No blood, no indigestion problems Diet- vegetarian? no Fhx colon cancer, celiac dz, bleeding disorder; none Has been taking iron supplement every other day  Anxiety -found out mom has cancer in December -has had 2 anxiety attacks in January - had fast HR, sweating, shaking, short of breath -has not been seeing therapist regularly -buspar 10mg  BID -zoloft 100mg  daily -requesting accommodations for work, short break every hour  PERTINENT  PMH / PSH: ***  OBJECTIVE:   BP 110/70   Pulse 83   Ht 5\' 10"  (1.778 m)   Wt 185 lb (83.9 kg)   LMP 01/28/2023   SpO2 98%   BMI 26.54 kg/m   ***  ASSESSMENT/PLAN:   No problem-specific Assessment & Plan notes found for this encounter.     Lorayne Bender, MD [facility]

## 2023-02-27 LAB — CBC
Hematocrit: 36.6 % (ref 34.0–46.6)
Hemoglobin: 11.1 g/dL (ref 11.1–15.9)
MCH: 23.6 pg — ABNORMAL LOW (ref 26.6–33.0)
MCHC: 30.3 g/dL — ABNORMAL LOW (ref 31.5–35.7)
MCV: 78 fL — ABNORMAL LOW (ref 79–97)
Platelets: 223 10*3/uL (ref 150–450)
RBC: 4.7 x10E6/uL (ref 3.77–5.28)
RDW: 15.9 % — ABNORMAL HIGH (ref 11.7–15.4)
WBC: 5.2 10*3/uL (ref 3.4–10.8)

## 2023-02-27 LAB — FERRITIN: Ferritin: 8 ng/mL — ABNORMAL LOW (ref 15–150)

## 2023-02-27 NOTE — Assessment & Plan Note (Signed)
A1c 5.9 when last checked in March 2024 --repeat A1c today

## 2023-02-27 NOTE — Assessment & Plan Note (Signed)
New stressor with family health concerns and 2 panic attacks over the past few weeks.  --increase Buspar to 15mg  BID --continue zoloft 100mg  daily --strongly advised reinitiating weekly therapy sessions, provided resource list --pt to discuss feasible work accommodations with supervisors, will support this effort as able

## 2023-02-27 NOTE — Assessment & Plan Note (Addendum)
No clear etiology beyond menstrual blood loss. Reports good adherence with oral iron supplement --recheck CBC and ferritin today --continue every other day oral iron

## 2023-02-28 ENCOUNTER — Encounter: Payer: Self-pay | Admitting: Family Medicine

## 2023-03-13 ENCOUNTER — Encounter: Payer: Self-pay | Admitting: Family Medicine

## 2023-03-16 ENCOUNTER — Ambulatory Visit: Payer: Self-pay | Admitting: Family Medicine

## 2023-03-16 NOTE — Telephone Encounter (Signed)
 Patient returns call to nurse line. Advised of need for another appointment.   Scheduled same day with Dr. Threasa Beards as patient needs paperwork to be submitted today.   Veronda Prude, RN

## 2023-03-29 ENCOUNTER — Ambulatory Visit: Payer: Self-pay | Admitting: Student

## 2023-03-29 ENCOUNTER — Encounter: Payer: Self-pay | Admitting: Student

## 2023-03-29 ENCOUNTER — Ambulatory Visit: Payer: Self-pay | Admitting: Family Medicine

## 2023-03-29 DIAGNOSIS — F411 Generalized anxiety disorder: Secondary | ICD-10-CM

## 2023-03-29 MED ORDER — SERTRALINE HCL 150 MG PO CAPS: 150.0000 mg | ORAL_CAPSULE | Freq: Every day | ORAL | 0 refills | Status: DC

## 2023-03-29 NOTE — Assessment & Plan Note (Signed)
 Seems to be slightly improved with increase BuSpar but still having intermittent jitteriness during work and concern for additional panic attacks. -Work letter provided for 1 day a month contingency plan if she does have a panic attack -Work accommodation to allow 20 to 30 minutes a day to decompress if she is starting to have anxiety during work -Increase Zoloft to 150 mg daily -Continue BuSpar 15 mg twice daily -4-week follow-up

## 2023-03-29 NOTE — Progress Notes (Signed)
    SUBJECTIVE:   CHIEF COMPLAINT / HPI: Anxiety fu  Presents requesting work accommodations due to her anxiety.  She reports having panic attacks, approximately once every other month, which are unpredictable and debilitating.  She is currently taking Zoloft 100mg  daily and Buspar 15 mg as needed She reports that the increased Buspar has helped, allowing her to get more rest and feel better at work. However, she still desires to have a contingency plan in place for when she has a panic attack.  The patient also reports a history of suicidal ideation, none currently, no SI or plans, relying on her faith and counseling for support.     03/29/2023    3:04 PM 02/26/2023    1:38 PM 12/29/2022    1:47 PM 12/01/2022    3:22 PM 10/23/2022    2:23 PM  Depression screen PHQ 2/9  Decreased Interest 1 1 1 1 1   Down, Depressed, Hopeless 1 1 0 0 2  PHQ - 2 Score 2 2 1 1 3   Altered sleeping 1 1 1 1 3   Tired, decreased energy 1 2 1 1 2   Change in appetite 1 1 0 0 1  Feeling bad or failure about yourself  0 0 0 0 2  Trouble concentrating 1 2 1 1 3   Moving slowly or fidgety/restless 2 3 2 1 3   Suicidal thoughts 0 0 0 0 0  PHQ-9 Score 8 11 6 5 17   Difficult doing work/chores Somewhat difficult  Somewhat difficult       PERTINENT  PMH / PSH: reviewed  OBJECTIVE:   BP 110/60   Pulse 74   Ht 5\' 10"  (1.778 m)   Wt 192 lb 6 oz (87.3 kg)   LMP 03/07/2023   SpO2 99%   BMI 27.60 kg/m   General: Well appearing, NAD, awake, alert, responsive to questions Head: Normocephalic atraumatic, MMM Respiratory: chest rises symmetrically,  no increased work of breathing Extremities: Moves all well Psych: Mood appropriate, pleasant, does not appear to be responding to any internal stimuli  ASSESSMENT/PLAN:   Assessment & Plan Generalized anxiety disorder Seems to be slightly improved with increase BuSpar but still having intermittent jitteriness during work and concern for additional panic attacks. -Work  letter provided for 1 day a month contingency plan if she does have a panic attack -Work accommodation to allow 20 to 30 minutes a day to decompress if she is starting to have anxiety during work -Increase Zoloft to 150 mg daily -Continue BuSpar 15 mg twice daily -4-week follow-up   Levin Erp, MD [facility]

## 2023-03-29 NOTE — Patient Instructions (Signed)
 It was great to see you! Thank you for allowing me to participate in your care!   Our plans for today:  - I am going to work on these papers and have you pick them up  - I would like to increase your zoloft to 150 mg to help with anxiety symptoms as well  Take care and seek immediate care sooner if you develop any concerns.  Levin Erp, MD

## 2023-03-30 ENCOUNTER — Telehealth: Payer: Self-pay | Admitting: Family Medicine

## 2023-03-30 ENCOUNTER — Other Ambulatory Visit: Payer: Self-pay | Admitting: Family Medicine

## 2023-03-30 DIAGNOSIS — F411 Generalized anxiety disorder: Secondary | ICD-10-CM

## 2023-03-30 NOTE — Telephone Encounter (Signed)
 patient dropped off form at front desk for Short-Term Disability.  Verified that patient section of form has been completed.  Last DOS/WCC with PCP was 03/29/23.  Placed form in blue team folder to be completed by clinical staff.  Elizabeth Hines

## 2023-04-02 NOTE — Telephone Encounter (Signed)
 Clinical info completed on Disability  form.  Placed form in PCP's box for completion.    When form is completed, please route note to "RN Team" and place in wall pocket in front office.   Aquilla Solian, CMA

## 2023-04-02 NOTE — Telephone Encounter (Signed)
 Patient is scheduled in [facility] tomorrow at 11:10 for form completion.

## 2023-04-03 ENCOUNTER — Telehealth: Payer: Self-pay | Admitting: Student

## 2023-04-03 ENCOUNTER — Ambulatory Visit (INDEPENDENT_AMBULATORY_CARE_PROVIDER_SITE_OTHER): Payer: Self-pay

## 2023-04-03 ENCOUNTER — Encounter: Payer: Self-pay | Admitting: Student

## 2023-04-03 DIAGNOSIS — F411 Generalized anxiety disorder: Secondary | ICD-10-CM

## 2023-04-03 MED ORDER — SERTRALINE HCL 150 MG PO CAPS
150.0000 mg | ORAL_CAPSULE | Freq: Every day | ORAL | 0 refills | Status: DC
Start: 2023-04-03 — End: 2023-04-06

## 2023-04-03 NOTE — Progress Notes (Signed)
    SUBJECTIVE:   CHIEF COMPLAINT / HPI:   Anxiety:  -Seen by Dr. Laroy Apple on 03/29/23 -Patient was seen for anxiety as well as panic attacks that are approximately once a month -Patient had an increase of her Zoloft to 150 mg daily but has been unable to obtain -Also taking BuSpar 15 mg twice a day -Patient reports that she would like a form filled out for short-term disability -Dr. 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 she has a panic attack as well as work accommodations to allow 20 to 30 minutes a day to decompress if she starts to have anxiety attacks -Patient reports that previously, she had to leave work approximately 2 to 4 hours early intermittently secondary to severe anxiety and panic attacks -She is requesting up-to-date information for her form  PERTINENT  PMH / PSH: Reviewed   OBJECTIVE:   BP 101/69   Pulse 96   Ht 5\' 10"  (1.778 m)   Wt 199 lb (90.3 kg)   LMP 03/07/2023   SpO2 98%   BMI 28.55 kg/m   General: NAD, pleasant, able to participate in exam Card: RRR, no m/g/r Respiratory: No respiratory distress Skin: warm and dry, no rashes noted Psych: Normal affect and mood   ASSESSMENT/PLAN:   Assessment & Plan Generalized anxiety disorder Work accommodations are reasonable, filled out form to the best of my ability as well as provided with a letter outlining contingency plans of having the ability to leave work 2-4 hours early up to three times during the work week for anxiety/panic attacks.  Instructed the patient to pick up her new Zoloft prescription as well as continuing BuSpar.  Instructed her to follow-up in 4 weeks with me or Dr. Willene Hatchet.  If she has more forms that need to be filled out, please place in my inbox as PCP is unavailable.      Alfredo Martinez, MD [facility]

## 2023-04-03 NOTE — Telephone Encounter (Signed)
 Retrieved form from PCP box and filled out to best of my ability. Placed in media tab and provided to the patient.

## 2023-04-03 NOTE — Patient Instructions (Signed)
 It was great to see you today! Thank you for choosing [facility] for your primary care.  Today we addressed: Please call and schedule appt with Dr. Jena Gauss or Dr. Laroy Apple in 4 weeks for anxiety  Also please tell the front office to provide any further forms to Dr. Jena Gauss  If you haven't already, sign up for My Chart to have easy access to your labs results, and communication with your primary care physician.  Please arrive 15 minutes before your appointment to ensure smooth check in process.  We appreciate your efforts in making this happen.  Thank you for allowing me to participate in your care, Elizabeth Martinez, MD 04/03/2023, 11:41 AM PGY-3, [facility]

## 2023-04-03 NOTE — Assessment & Plan Note (Signed)
 Work accommodations are reasonable, filled out form to the best of my ability as well as provided with a letter outlining contingency plans of having the ability to leave work 2-4 hours early up to three times during the work week for anxiety/panic attacks.  Instructed the patient to pick up her new Zoloft prescription as well as continuing BuSpar.  Instructed her to follow-up in 4 weeks with me or Dr. Willene Hatchet.  If she has more forms that need to be filled out, please place in my inbox as PCP is unavailable.

## 2023-04-04 ENCOUNTER — Encounter: Payer: Self-pay | Admitting: Student

## 2023-04-05 NOTE — Telephone Encounter (Signed)
 Called pharmacy.   Patient does not have current insurance.   Pharmacy reports Sertraline 150mg  only comes in capsule form and is uncommon, which makes the price ~ 200 dollars.  Sertraline 100mg  is 9 dollars for 30 days and Sertraline 50mg  is 14 dollars for 30 days. Pharmacy reports possible extra savings with a discount card if applicable.   Discussed above with patient and she is amendable to plan.  I canceled Sertraline 150 at the pharmacy,   Please send in 50mg  and 100mg .   Will forward to provider who saw patient.

## 2023-04-06 MED ORDER — SERTRALINE HCL 50 MG PO TABS: 50.0000 mg | ORAL_TABLET | Freq: Every day | ORAL | 0 refills | Status: DC

## 2023-04-06 MED ORDER — SERTRALINE HCL 100 MG PO TABS: 100.0000 mg | ORAL_TABLET | Freq: Every day | ORAL | 0 refills | Status: DC

## 2023-05-10 ENCOUNTER — Ambulatory Visit: Payer: Self-pay | Admitting: Student

## 2023-05-10 VITALS — BP 100/60 | HR 73 | Ht 70.0 in | Wt 194.0 lb

## 2023-05-10 DIAGNOSIS — F411 Generalized anxiety disorder: Secondary | ICD-10-CM

## 2023-05-10 MED ORDER — SERTRALINE HCL 100 MG PO TABS
200.0000 mg | ORAL_TABLET | Freq: Every day | ORAL | 0 refills | Status: DC
Start: 2023-05-10 — End: 2023-07-13

## 2023-05-10 NOTE — Assessment & Plan Note (Signed)
 Persistent fidgetiness and anxiety, especially at work. No panic attacks anymore.  - Increase Zoloft to 200 mg daily. - Continue Buspar 15 mg twice daily. - Follow up in one month to reassess anxiety symptoms and medication efficacy.

## 2023-05-10 NOTE — Patient Instructions (Signed)
 It was great to see you! Thank you for allowing me to participate in your care!   Our plans for today:  - We will increase your zoloft to 200 mg daily - Please follow up in 1 month  Take care and seek immediate care sooner if you develop any concerns.  Genora Kidd, MD

## 2023-05-10 NOTE — Progress Notes (Signed)
    SUBJECTIVE:   CHIEF COMPLAINT / HPI: GAD fu  Discussed the use of AI scribe software for clinical note transcription with the patient, who gave verbal consent to proceed.  Zoloft increased to 150 mg and Buspar 15 mg since 3/12  History of Present Illness The patient, with a history of anxiety, has been on Zoloft 150mg  for about a month. She reports feeling 'a little bit more mellow' but still experiences days of feeling 'fidgety and anxious.' However, she has not had any anxiety/panic attacks. She continues to take Buspar twice daily, which she feels helps, but it makes her drowsy at work. She has not experienced any side effects from the increased dose of Zoloft. The patient also expresses concern about her mother, who was recently diagnosed with stage two bladder cancer, which has been causing her additional stress and worry.      05/10/2023   10:07 AM 04/03/2023   11:17 AM 03/29/2023    3:04 PM 02/26/2023    1:38 PM 12/29/2022    1:47 PM  Depression screen PHQ 2/9  Decreased Interest 1 1 1 1 1   Down, Depressed, Hopeless 1 1 1 1  0  PHQ - 2 Score 2 2 2 2 1   Altered sleeping 1 1 1 1 1   Tired, decreased energy 2 2 1 2 1   Change in appetite 1 1 1 1  0  Feeling bad or failure about yourself  0 1 0 0 0  Trouble concentrating 2 1 1 2 1   Moving slowly or fidgety/restless 3 3 2 3 2   Suicidal thoughts 0 0 0 0 0  PHQ-9 Score 11 11 8 11 6   Difficult doing work/chores Somewhat difficult  Somewhat difficult  Somewhat difficult       05/10/2023   10:06 AM 03/29/2023    3:04 PM 02/26/2023    1:39 PM 12/29/2022    1:49 PM  GAD 7 : Generalized Anxiety Score  Nervous, Anxious, on Edge 2 2 1 1   Control/stop worrying 1 2 1 1   Worry too much - different things 1 1 0 1  Trouble relaxing 1 1 1 1   Restless 3 1 2 2   Easily annoyed or irritable 2 1 1 1   Afraid - awful might happen 1 1 0 1  Total GAD 7 Score 11 9 6 8   Anxiety Difficulty Somewhat difficult        PERTINENT  PMH / PSH:  reviewed  OBJECTIVE:   BP 100/60   Pulse 73   Ht 5\' 10"  (1.778 m)   Wt 194 lb (88 kg)   LMP 04/19/2023   SpO2 99%   BMI 27.84 kg/m   General: Well appearing, NAD, awake, alert, responsive to questions Psych: Affect appropriate, pleasant, does not appear to be responding to any internal stimuli Head: Normocephalic atraumatic Respiratory: chest rises symmetrically,  no increased work of breathing  ASSESSMENT/PLAN:   Assessment & Plan Generalized anxiety disorder Persistent fidgetiness and anxiety, especially at work. No panic attacks anymore.  - Increase Zoloft to 200 mg daily. - Continue Buspar 15 mg twice daily. - Follow up in one month to reassess anxiety symptoms and medication efficacy.   Genora Kidd, MD [facility]

## 2023-05-19 ENCOUNTER — Other Ambulatory Visit: Payer: Self-pay | Admitting: Student

## 2023-05-19 DIAGNOSIS — F411 Generalized anxiety disorder: Secondary | ICD-10-CM

## 2023-05-21 ENCOUNTER — Other Ambulatory Visit: Payer: Self-pay | Admitting: Student

## 2023-05-21 DIAGNOSIS — F411 Generalized anxiety disorder: Secondary | ICD-10-CM

## 2023-05-21 NOTE — Telephone Encounter (Signed)
 Called pharmacy.  They will be getting patients new dose of Sertraline  ready this evening.   Patient aware.

## 2023-06-18 ENCOUNTER — Other Ambulatory Visit: Payer: Self-pay | Admitting: Family Medicine

## 2023-06-18 DIAGNOSIS — F411 Generalized anxiety disorder: Secondary | ICD-10-CM

## 2023-06-22 ENCOUNTER — Encounter: Payer: Self-pay | Admitting: Family Medicine

## 2023-06-22 ENCOUNTER — Ambulatory Visit (INDEPENDENT_AMBULATORY_CARE_PROVIDER_SITE_OTHER): Payer: Self-pay | Admitting: Family Medicine

## 2023-06-22 VITALS — BP 111/83 | HR 81 | Ht 70.0 in | Wt 202.4 lb

## 2023-06-22 DIAGNOSIS — F411 Generalized anxiety disorder: Secondary | ICD-10-CM

## 2023-06-22 DIAGNOSIS — Z3009 Encounter for other general counseling and advice on contraception: Secondary | ICD-10-CM

## 2023-06-22 DIAGNOSIS — N920 Excessive and frequent menstruation with regular cycle: Secondary | ICD-10-CM

## 2023-06-22 NOTE — Progress Notes (Cosign Needed Addendum)
    SUBJECTIVE:   CHIEF COMPLAINT / HPI:    Anxiety follow-up - PHQ-9 score 11, notable for feeling tired, little energy, trouble concentrating, speaking slowly.  No SI or HI - GAD-7 score 14, notable for not being able to stop worrying, worrying about different things, being restless and being annoyed - Taking BuSpar  15 mg twice daily and Zoloft  200 mg daily, comfortable staying on this regimen - still a bit shaky - mom was diagnosed with bladder cancer recently, last round of chemo and surgery in July, she feels that this is really caused a lot of stress and anxiety - has been seeing a therapist recently but counselor has been busy, needs to get back in touch with her to set up appt - overall feels better, sleep has improved - Discussed ways to cope with stress, anxiety, sadness including regular therapy, journaling, brain, speaking with family and friends, etc.   Iron  deficiency anemia - Has been taking iron  supplement every other day - no issues with constipation - has had some lightheadedness, feeling off balance for the past few days - Denies falls - LMP 5/27, states lightheadedness went away once menses started - heaviest day of period, goes thru about 4-5 pads and tampons, does not want to do anything that day  Contraception - did Depo when she was 18, had weight gain and did not like how she felt - no family history of breast cancer - no personal history of migraines - never smoker - Has not tried any other form of birth control   PERTINENT  PMH / PSH: Anxiety, IDA, 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  Discussed that symptoms of feeling tired, low energy, trouble concentrating may be related to known iron  deficiency anemia likely due to menorrhagia.  Patient also has recent stressor of mother being diagnosed with cancer to add to her symptoms. --Continue BuSpar  15 mg twice daily --Continue Zoloft  200 mg daily --Encourage patient to schedule regular appointments with therapist --Scheduled follow-up appointment with me for June 20th Menorrhagia with regular cycle Patient reports heavy bleeding during menses.  Discussed contraception options at length, including Nexplanon and IUD, as these may help decrease menstrual bleeding and therefore symptoms of IDA.  Patient to consider these options and follow-up at next appointment.  Will check labs today to assess degree of iron  deficiency and/or anemia - TIBC and Ferritin Panel - CBC - Continue iron  supplement every other day  Encounter for counseling regarding contraception Pt considering LARC with either nexplanon vs IUD.  Discussed risks/benefits of these options, patient to consider and schedule follow-up appointment as needed.  Provided handouts about Nexplanon/IUD     Naida Austria, MD [facility]

## 2023-06-22 NOTE — Patient Instructions (Signed)
 Thank you for coming in today! Here is a summary of what we discussed:  - We will keep the medicines the same for now  - Please consider the birth control options we discussed (Nexplanon, or hormonal IUD). See the attached forms for more information or visit [email or web address]  - I will see you in June!   We are checking some labs today. If they are abnormal, I will call you. If they are normal, I will send you a MyChart message (if it is active) or a letter in the mail. If you do not hear about your labs in the next 2 weeks, please call the office.   Please call the clinic at [phone] if your symptoms worsen or you have any concerns.  Best, Dr Sampson Critchley

## 2023-06-22 NOTE — Assessment & Plan Note (Signed)
 Patient reports heavy bleeding during menses.  Discussed contraception options at length, including Nexplanon and IUD, as these may help decrease menstrual bleeding and therefore symptoms of IDA.  Patient to consider these options and follow-up at next appointment.  Will check labs today to assess degree of iron  deficiency and/or anemia - TIBC and Ferritin Panel - CBC - Continue iron  supplement every other day

## 2023-06-22 NOTE — Assessment & Plan Note (Addendum)
 Patient feels comfortable on BuSpar  and Zoloft  (this was recently increased to 200 mg daily).  Discussed that symptoms of feeling tired, low energy, trouble concentrating may be related to known iron  deficiency anemia likely due to menorrhagia.  Patient also has recent stressor of mother being diagnosed with cancer to add to her symptoms. --Continue BuSpar  15 mg twice daily --Continue Zoloft  200 mg daily --Encourage patient to schedule regular appointments with therapist --Scheduled follow-up appointment with me for June 20th

## 2023-06-23 LAB — IRON,TIBC AND FERRITIN PANEL
Ferritin: 9 ng/mL — ABNORMAL LOW (ref 15–150)
Iron Saturation: 4 % — CL (ref 15–55)
Iron: 14 ug/dL — ABNORMAL LOW (ref 27–159)
Total Iron Binding Capacity: 377 ug/dL (ref 250–450)
UIBC: 363 ug/dL (ref 131–425)

## 2023-06-23 LAB — CBC
Hematocrit: 33.1 % — ABNORMAL LOW (ref 34.0–46.6)
Hemoglobin: 10.2 g/dL — ABNORMAL LOW (ref 11.1–15.9)
MCH: 24.3 pg — ABNORMAL LOW (ref 26.6–33.0)
MCHC: 30.8 g/dL — ABNORMAL LOW (ref 31.5–35.7)
MCV: 79 fL (ref 79–97)
Platelets: 238 10*3/uL (ref 150–450)
RBC: 4.19 x10E6/uL (ref 3.77–5.28)
RDW: 16.5 % — ABNORMAL HIGH (ref 11.7–15.4)
WBC: 5.7 10*3/uL (ref 3.4–10.8)

## 2023-06-24 ENCOUNTER — Ambulatory Visit: Payer: Self-pay | Admitting: Family Medicine

## 2023-07-13 ENCOUNTER — Ambulatory Visit: Payer: Self-pay | Admitting: Family Medicine

## 2023-07-13 VITALS — BP 100/70 | HR 80 | Ht 70.0 in | Wt 202.2 lb

## 2023-07-13 DIAGNOSIS — D509 Iron deficiency anemia, unspecified: Secondary | ICD-10-CM

## 2023-07-13 DIAGNOSIS — F411 Generalized anxiety disorder: Secondary | ICD-10-CM

## 2023-07-13 MED ORDER — SERTRALINE HCL 100 MG PO TABS: 200.0000 mg | ORAL_TABLET | Freq: Every day | ORAL | 3 refills | Status: DC

## 2023-07-13 MED ORDER — FERROUS SULFATE 325 (65 FE) MG PO TABS
325.0000 mg | ORAL_TABLET | ORAL | Status: AC
Start: 2023-07-13 — End: ?

## 2023-07-13 MED ORDER — BUSPIRONE HCL 15 MG PO TABS: 15.0000 mg | ORAL_TABLET | Freq: Two times a day (BID) | ORAL | 3 refills | Status: DC

## 2023-07-13 NOTE — Assessment & Plan Note (Signed)
 Denies new or worsening symptoms today. Declines contraception due to concerns around potential infection from placement and preference for more natural options. Discussed that contraceptives may significantly decrease anemia and associated symptoms. Recommended IV iron  infusion, unfortunately this will be too costly for patient given lack of insurance. Referral to [employer] program and [facility] referral for insurance and financial assistance resources place. Pt to continue oral Fe supplement.  - AMB Referral [facility] Care Management - ferrous sulfate  325 (65 FE) MG tablet; Take 1 tablet (325 mg total) by mouth every other day.

## 2023-07-13 NOTE — Progress Notes (Cosign Needed)
    SUBJECTIVE:   CHIEF COMPLAINT / HPI:   Generalized anxiety with panic -had an episode of anxiety attack at work in early June, was able to go home early -states mom is doing okay (recently diagnosed with cancer) -GAD7 score 9 today -PHQ9 score 11 today -Denies SI/HI -taking BuSpar  15 mg twice daily and Zoloft  200 mg daily, feels this are working fine for her -hasn't connected with therapist yet  IDA -ferritin 9, iron  saturation 4, and Hgb 10.2 at last visit -taking oral Fe daily - Does not currently have insurance  Contraception -discussed at last visit as this would help significantly with IDA -pt expressed anxiety around placement of LARC and prefers more natural options   PERTINENT  PMH / PSH: As above  OBJECTIVE:   BP 100/70   Pulse 80   Ht 5' 10 (1.778 m)   Wt 202 lb 3.2 oz (91.7 kg)   LMP 06/19/2023   SpO2 96%   BMI 29.01 kg/m    - General: No acute distress. Awake and conversant. - Eyes: Normal conjunctiva, anicteric. Round symmetric pupils. - ENT: Hearing grossly intact. No nasal discharge. - Neck: Neck is supple. No masses or thyromegaly. - Respiratory: Respirations are non-labored. - Skin: No visible rashes or ulcers. - Psych: Alert and oriented. Cooperative, Appropriate mood and affect, Normal judgment. - MSK: Normal ambulation.   ASSESSMENT/PLAN:   Assessment & Plan Generalized anxiety disorder GAD 7 and PHQ-9 scores appropriate today. No SI/HI.  Pt feels comfortable continuing with current medications. Provided refills. Encouraged pt to connect with therapist. - sertraline  (ZOLOFT ) 100 MG tablet; Take 2 tablets (200 mg total) by mouth daily.  Dispense: 60 tablet; Refill: 3 - busPIRone  (BUSPAR ) 15 MG tablet; Take 1 tablet (15 mg total) by mouth 2 (two) times daily.  Dispense: 60 tablet; Refill: 3 Iron  deficiency anemia, unspecified iron  deficiency anemia type Denies new or worsening symptoms today. Declines contraception due to concerns around  potential infection from placement and preference for more natural options. Discussed that contraceptives may significantly decrease anemia and associated symptoms. Recommended IV iron  infusion, unfortunately this will be too costly for patient given lack of insurance. Referral to [employer] program and [facility] referral for insurance and financial assistance resources place. Pt to continue oral Fe supplement.  - AMB Referral [facility] Care Management - ferrous sulfate  325 (65 FE) MG tablet; Take 1 tablet (325 mg total) by mouth every other day.     Naida Austria, MD [facility]

## 2023-07-13 NOTE — Assessment & Plan Note (Signed)
 GAD 7 and PHQ-9 scores appropriate today. No SI/HI.  Pt feels comfortable continuing with current medications. Provided refills. Encouraged pt to connect with therapist. - sertraline  (ZOLOFT ) 100 MG tablet; Take 2 tablets (200 mg total) by mouth daily.  Dispense: 60 tablet; Refill: 3 - busPIRone  (BUSPAR ) 15 MG tablet; Take 1 tablet (15 mg total) by mouth 2 (two) times daily.  Dispense: 60 tablet; Refill: 3

## 2023-07-13 NOTE — Patient Instructions (Signed)
 Thank you for coming in today! Here is a summary of what we discussed:  To enroll in [facility] Assistance, please call [phone] Ask to speak with Constantino Demark.   Constantino Demark is located at: [address] [place], [place]  His office is open Tuesday, Wednesday, Thursday from 8:30a to 4:30p (Closed Monday & Friday)  Please call the clinic at [phone] if your symptoms worsen or you have any concerns.  Best, Dr Sampson Critchley

## 2023-07-23 ENCOUNTER — Telehealth: Payer: Self-pay | Admitting: *Deleted

## 2023-07-23 NOTE — Progress Notes (Signed)
 Complex Care Management Note  Care Guide Note 07/23/2023 Name: Akeya Ryther MRN: [id number] DOB: Apr 07, 1985  Qamar Rosman is a 38 y.o. year old female who sees Hindel, Leah, MD for primary care. I reached out to Rexene Silversmith by phone today to offer complex care management services.  Ms. Burtch was given information about Complex Care Management services today including:   The Complex Care Management services include support from the care team which includes your Nurse Care Manager, Clinical Social Worker, or Pharmacist.  The Complex Care Management team is here to help remove barriers to the health concerns and goals most important to you. Complex Care Management services are voluntary, and the patient may decline or stop services at any time by request to their care team member.   Complex Care Management Consent Status: Patient did not agree to participate in complex care management services at this time.  Follow up plan: None   Encounter Outcome:  Patient Refused  Harlene Satterfield  [facility]  [facility], [facility] Guide  Direct Dial: [phone]  Fax [phone]

## 2023-08-10 ENCOUNTER — Ambulatory Visit: Payer: Self-pay

## 2023-08-27 ENCOUNTER — Telehealth: Payer: Self-pay

## 2023-08-27 NOTE — Telephone Encounter (Signed)
-----   Message from [facility] sent at 08/27/2023 12:28 PM EDT ----- Regarding: FMLA clarification Hey team,  Can you please call this patient and clarify what she is requesting specifically for her FMLA paperwork? If it is complex she may need to schedule a virtual appt to discuss.  Thanks, Dr Adele

## 2023-08-27 NOTE — Telephone Encounter (Signed)
 Attempted to reach patient. No answer. LVM of note left by Dr. Adele. For patient to call the office with this information. Nelson Land, CMA

## 2023-08-27 NOTE — Telephone Encounter (Signed)
 Patient returns call to nurse line.   Patient has an apt with PCP scheduled already for 8/15. She reports the FMLA is due on 8/17.  She reports she does not want to make (2) apts. She reports the FMLA is for her anxiety.  She reports you can reach out to her via mychart for any questions regards paperwork.   She reports if she needs to wait until 8/15 this is fine too, however FMLA is due 8/17.  Will forward to PCP.

## 2023-08-28 NOTE — Telephone Encounter (Signed)
 Spoke with patient regarding note left by PCP. Patient stated that she would like to be seen earlier. Made appt for 8/8 at 3:10. Patient advised to bring in FMLA forms. Nelson Land, CMA

## 2023-08-31 ENCOUNTER — Ambulatory Visit (INDEPENDENT_AMBULATORY_CARE_PROVIDER_SITE_OTHER): Payer: Self-pay | Admitting: Family Medicine

## 2023-08-31 ENCOUNTER — Encounter: Payer: Self-pay | Admitting: Family Medicine

## 2023-08-31 VITALS — BP 124/75 | HR 79 | Ht 70.0 in | Wt 203.2 lb

## 2023-08-31 DIAGNOSIS — F411 Generalized anxiety disorder: Secondary | ICD-10-CM

## 2023-08-31 DIAGNOSIS — D509 Iron deficiency anemia, unspecified: Secondary | ICD-10-CM

## 2023-08-31 DIAGNOSIS — F331 Major depressive disorder, recurrent, moderate: Secondary | ICD-10-CM

## 2023-08-31 DIAGNOSIS — N920 Excessive and frequent menstruation with regular cycle: Secondary | ICD-10-CM

## 2023-08-31 NOTE — Assessment & Plan Note (Signed)
 Pt doing well and satisfied with current regiment. Continue current regimen. Completed FMLA form. Advised follow up as needed.

## 2023-08-31 NOTE — Assessment & Plan Note (Signed)
 Declined IV iron  infusion at this time. Continue oral Fe supplements every other day.

## 2023-08-31 NOTE — Progress Notes (Signed)
    SUBJECTIVE:   CHIEF COMPLAINT / HPI:   FMLA form   MDD GAD --PHQ9 score 10, negative for SI/HI --GAD7 score 9 today, 3 for restlessness --Hasn't connected with therapist yet, states therapist is very busy -- Happy with current medication regimen, does not want to make changes at this time  Iron  deficiency anemia, likely secondary to heavy menses -- Discussed IV iron  at previous appointments, patient is uninsured and requires financial assistance for this -- Patient declined complex care services -- Patient has not reached out to [facility] financial assistance program -- States she is not interested in IV iron  infusion at this time -- Still taking oral iron  supplement every other day -- no issues with constipation   PERTINENT  PMH / PSH: GAD, MDD, IDA, tic disorder  OBJECTIVE:   BP 124/75   Pulse 79   Ht 5' 10 (1.778 m)   Wt 203 lb 3.2 oz (92.2 kg)   SpO2 100%   BMI 29.16 kg/m   General: Awake and conversant, in no acute distress Respiratory: Normal work of breathing on room air Psych: Appropriate mood and affect Neuro: No focal deficits, normal gait  ASSESSMENT/PLAN:   Assessment & Plan Major depressive disorder, recurrent episode, moderate (HCC) Generalized anxiety disorder Pt doing well and satisfied with current regiment. Continue current regimen. Completed FMLA form. Advised follow up as needed.  Iron  deficiency anemia, unspecified iron  deficiency anemia type Declined IV iron  infusion at this time. Continue oral Fe supplements every other day. Menorrhagia with regular cycle Pt not interested in contraception at this time. Advised condom use.     Rea Raring, MD [facility]

## 2023-08-31 NOTE — Patient Instructions (Signed)
 Thank you for coming in today! Here is a summary of what we discussed:  Sounds like things are going okay with your meds. Please let me know if things change or if you have questions.  Please call the clinic at [phone] if your symptoms worsen or you have any concerns.  Best, Dr Adele

## 2023-09-07 ENCOUNTER — Ambulatory Visit: Payer: Self-pay | Admitting: Family Medicine

## 2023-09-10 ENCOUNTER — Encounter: Payer: Self-pay | Admitting: Family Medicine

## 2023-09-13 ENCOUNTER — Telehealth: Payer: Self-pay | Admitting: Family Medicine

## 2023-09-13 DIAGNOSIS — F411 Generalized anxiety disorder: Secondary | ICD-10-CM

## 2023-09-13 NOTE — Progress Notes (Signed)
 [facility] Telemedicine Visit  Patient consented to have virtual visit and was identified by name and date of birth. Method of visit: Video  Encounter participants: Patient: Elizabeth Hines - located at place of work Provider: Rea Raring - located at [facility] office  Chief Complaint: Follow-up about FMLA paperwork  HPI:  Following up about FMLA paperwork  --had several absences last year, had to leave work due to anxiety/panic episodes -- These dates include 8/6 (ED visit for anxiety few days prior), 8/26 (behavioral health appointment), 9/9-9/13 (had ED visit for anxiety at that time), 9/18 (behavioral health visit day prior), 9/25-9/27 (April health visits), 9/30 (family medicine office visit), 10/8 (behavioral health visit few days prior), 2/3 (family medicine appointment)  -- Work accommodations note written 9/30, asking for 10-minute breaks throughout the day  --got in touch with counselor and is trying to set up appointments   ROS: per HPI  Pertinent PMHx: Anxiety, panic disorder, tic disorder iron  deficiency anemia  Exam:  There were no vitals taken for this visit.  Respiratory: Nonlabored on room air Psych: Appropriate mood and affect  Assessment/Plan:  1. Generalized anxiety disorder (Primary) Following up about FMLA paperwork.  I have filled out this paperwork and resubmitted it to her employer.  Will be happy to submit further documentation of office visits or other treatment of anxiety/depression if needed assist with FMLA request    Time spent during visit with patient: 5 minutes

## 2023-09-26 ENCOUNTER — Other Ambulatory Visit: Payer: Self-pay | Admitting: Family Medicine

## 2023-09-26 ENCOUNTER — Telehealth: Payer: Self-pay | Admitting: Family Medicine

## 2023-09-26 DIAGNOSIS — F411 Generalized anxiety disorder: Secondary | ICD-10-CM

## 2023-10-05 ENCOUNTER — Telehealth: Payer: Self-pay

## 2023-10-05 NOTE — Telephone Encounter (Signed)
 Patient LVM on nurse line reporting she missed a phone call.   She reports she believes it was in regards to [facility].   I attempted to call patient to get more information, however no answer.   Will forward to PCP.

## 2023-12-19 ENCOUNTER — Telehealth (INDEPENDENT_AMBULATORY_CARE_PROVIDER_SITE_OTHER): Payer: Self-pay | Admitting: Family Medicine

## 2023-12-19 DIAGNOSIS — F331 Major depressive disorder, recurrent, moderate: Secondary | ICD-10-CM

## 2023-12-19 DIAGNOSIS — F411 Generalized anxiety disorder: Secondary | ICD-10-CM

## 2023-12-19 DIAGNOSIS — I498 Other specified cardiac arrhythmias: Secondary | ICD-10-CM

## 2023-12-19 MED ORDER — BUSPIRONE HCL 10 MG PO TABS: 10.0000 mg | ORAL_TABLET | Freq: Three times a day (TID) | ORAL | 0 refills | Status: AC

## 2023-12-19 NOTE — Progress Notes (Signed)
  [facility]icine Visit  Patient consented to have virtual visit and was identified by name and date of birth. Method of visit: Video was attempted but was interrupted: <50% of visit completed via video. Remainder completed via telephone  Encounter participants: Patient: Elizabeth Hines - located at home Provider: Rea Raring - located at [facility] office Others (if applicable): none  Chief Complaint: fatigue  HPI:  Noticed more fatigue/drowsiness in Oct, believes it is associated with the Buspar  Feels lightheaded or dizzy if she misses zoloft . States medicine is helping with anxiety/depression Work is going well Had an episode of heart fluttering that lasted for about 3 minutes associated with anxiety, deep breathing helped.  Felt similar to prior episodes of heart fluttering that she is experience with her anxiety/panic attacks States she is sleeping well No thoughts of SI/self-harm Visiting mom for thanksgiving  ROS: per HPI  Pertinent PMHx: Panic, anxiety, depression  Exam:  There were no vitals taken for this visit.  Respiratory: Speaking in full sentences, normal work of breathing on video  Assessment/Plan:   1. Major depressive disorder, recurrent episode, moderate (HCC) (Primary)  2. Generalized anxiety disorder Patient believes fatigue during the day is related to BuSpar .  Will reduce dose from 15 mg twice daily to 10 mg twice daily.  Will continue Zoloft  at 200 mg daily.  Advised follow-up in 2 to 3 weeks to monitor for symptom change. - busPIRone  (BUSPAR ) 10 MG tablet; Take 1 tablet (10 mg total) by mouth 3 (three) times daily.  Dispense: 60 tablet; Refill: 0  3. Fluttering heart Reported 1 episode of this recently.  Sounds like it is associated with her anxiety/panic given similar history of this and that she was able to improve it with deep breathing.  Advised patient to monitor and if symptoms are persisting or getting worse, recommend in  person visit for possible EKG, blood work, other workup.   Time spent during visit with patient: 10 minutes

## 2024-01-11 ENCOUNTER — Telehealth: Payer: Self-pay

## 2024-01-11 NOTE — Telephone Encounter (Signed)
 Received the following staff message regarding patient.   Good morning!  Please call this pt to schedule an appt (can be virtual if preferred) to review FMLA paperwork. Looks like the paperwork is due Dec 29th so likely will need to be with a different provider (vs ATC with me on Dec 29 AM).   Thanks!   Called patient this morning and LVM requesting that patient call back to schedule.   Chiquita JAYSON English, RN

## 2024-01-13 ENCOUNTER — Encounter: Payer: Self-pay | Admitting: Family Medicine

## 2024-01-15 ENCOUNTER — Encounter: Payer: Self-pay | Admitting: Family Medicine

## 2024-01-15 ENCOUNTER — Telehealth (INDEPENDENT_AMBULATORY_CARE_PROVIDER_SITE_OTHER): Payer: Self-pay | Admitting: Family Medicine

## 2024-01-15 VITALS — Ht 70.0 in | Wt 209.0 lb

## 2024-01-15 DIAGNOSIS — F411 Generalized anxiety disorder: Secondary | ICD-10-CM | POA: Diagnosis not present

## 2024-01-15 NOTE — Progress Notes (Signed)
 Virtual Visit via Video Note  I connected with Elizabeth Hines on 01/15/2024 at  1:30 PM EST by a video enabled telemedicine application and verified that I am speaking with the correct person using two identifiers.  Location: Patient: Elizabeth Hines Provider: Elyce Prescott   I discussed the limitations of evaluation and management by telemedicine and the availability of in person appointments. The patient expressed understanding and agreed to proceed.  History of Present Illness: Patient presents for [facility] paperwork.  Longstanding history of anxiety and panic disorder which she has seen her PCP for.  It frequently causes her to need to leave work.    Observations/Objective: Well-appearing, no acute distress   Assessment and Plan: Anxiety disorder  Paperwork filled out PCP recently prescribed BuSpar  for anxiety, patient has not picked this up yet.  I confirmed the dosing with her-it looks like she was supposed to take 10 mg twice a day but was prescribed 3 times a day. Pt comfortable w plan   Follow Up Instructions:    I discussed the assessment and treatment plan with the patient. The patient was provided an opportunity to ask questions and all were answered. The patient agreed with the plan and demonstrated an understanding of the instructions.   The patient was advised to call back or seek an in-person evaluation if the symptoms worsen or if the condition fails to improve as anticipated.  I provided 20 minutes of non-face-to-face time during this encounter.   Wilmot Quevedo, DO
